# Patient Record
Sex: Female | Born: 1935 | Race: White | Hispanic: No | Smoking: Never smoker
Health system: Southern US, Community
[De-identification: ages and names within clinical notes are randomized; demographics above are authoritative.]

## PROBLEM LIST (undated history)

## (undated) DIAGNOSIS — G20 Parkinson's disease: Secondary | ICD-10-CM

## (undated) DIAGNOSIS — F319 Bipolar disorder, unspecified: Secondary | ICD-10-CM

## (undated) DIAGNOSIS — G20A1 Parkinson's disease without dyskinesia, without mention of fluctuations: Secondary | ICD-10-CM

## (undated) HISTORY — PX: ABDOMINAL HYSTERECTOMY: SHX81

## (undated) HISTORY — PX: COLON SURGERY: SHX602

---

## 2017-04-15 ENCOUNTER — Emergency Department: Payer: Medicare Other

## 2017-04-15 ENCOUNTER — Emergency Department
Admission: EM | Admit: 2017-04-15 | Discharge: 2017-04-16 | Disposition: A | Discharge status: Home / Self Care | Payer: Medicare Other | Attending: Emergency Medicine | Admitting: Emergency Medicine

## 2017-04-15 ENCOUNTER — Encounter: Payer: Self-pay | Admitting: Emergency Medicine

## 2017-04-15 DIAGNOSIS — R0982 Postnasal drip: Secondary | ICD-10-CM | POA: Insufficient documentation

## 2017-04-15 DIAGNOSIS — R140 Abdominal distension (gaseous): Secondary | ICD-10-CM

## 2017-04-15 DIAGNOSIS — G20 Parkinson's disease: Secondary | ICD-10-CM | POA: Insufficient documentation

## 2017-04-15 DIAGNOSIS — R05 Cough: Secondary | ICD-10-CM | POA: Insufficient documentation

## 2017-04-15 DIAGNOSIS — J309 Allergic rhinitis, unspecified: Secondary | ICD-10-CM | POA: Diagnosis not present

## 2017-04-15 DIAGNOSIS — R053 Chronic cough: Secondary | ICD-10-CM

## 2017-04-15 HISTORY — DX: Parkinson's disease without dyskinesia, without mention of fluctuations: G20.A1

## 2017-04-15 HISTORY — DX: Parkinson's disease: G20

## 2017-04-15 HISTORY — DX: Bipolar disorder, unspecified: F31.9

## 2017-04-15 MED ORDER — DOXEPIN HCL 10 MG PO CAPS
10.0000 mg | ORAL_CAPSULE | Freq: Once | ORAL | Status: AC
  Administered 2017-04-15: 10 mg via ORAL
  Filled 2017-04-15: qty 1

## 2017-04-15 MED ORDER — SODIUM CHLORIDE 0.9 % IV BOLUS (SEPSIS)
1000.0000 mL | Freq: Once | INTRAVENOUS | Status: AC
  Administered 2017-04-15: 1000 mL via INTRAVENOUS

## 2017-04-15 MED ORDER — DOCUSATE SODIUM 100 MG PO CAPS
200.0000 mg | ORAL_CAPSULE | Freq: Once | ORAL | Status: AC
  Administered 2017-04-15: 200 mg via ORAL
  Filled 2017-04-15: qty 2

## 2017-04-15 MED ORDER — PRIMIDONE 50 MG PO TABS
150.0000 mg | ORAL_TABLET | Freq: Once | ORAL | Status: AC
  Administered 2017-04-15: 150 mg via ORAL
  Filled 2017-04-15: qty 3

## 2017-04-15 MED ORDER — IOPAMIDOL (ISOVUE-300) INJECTION 61%
30.0000 mL | Freq: Once | INTRAVENOUS | Status: AC | PRN
  Administered 2017-04-15: 30 mL via ORAL

## 2017-04-15 NOTE — ED Notes (Signed)
Pt in xr
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
## 2017-04-15 NOTE — ED Triage Notes (Signed)
Patient has had a cough times a year but the cough has become worse over the past week. Patient does not have any shortness of breath.

## 2017-04-16 ENCOUNTER — Emergency Department: Payer: Medicare Other

## 2017-04-16 LAB — COMPREHENSIVE METABOLIC PANEL
ALBUMIN: 3.8 g/dL (ref 3.5–5.0)
ALT: 6 U/L — ABNORMAL LOW (ref 14–54)
AST: 15 U/L (ref 15–41)
Alkaline Phosphatase: 71 U/L (ref 38–126)
Anion gap: 8 (ref 5–15)
BILIRUBIN TOTAL: 0.4 mg/dL (ref 0.3–1.2)
BUN: 20 mg/dL (ref 6–20)
CHLORIDE: 94 mmol/L — AB (ref 101–111)
CO2: 32 mmol/L (ref 22–32)
Calcium: 9.2 mg/dL (ref 8.9–10.3)
Creatinine, Ser: 0.58 mg/dL (ref 0.44–1.00)
GFR calc Af Amer: >60 mL/min (ref >60)
GFR calc non Af Amer: >60 mL/min (ref >60)
GLUCOSE: 89 mg/dL (ref 65–99)
POTASSIUM: 3.9 mmol/L (ref 3.5–5.1)
Sodium: 134 mmol/L — ABNORMAL LOW (ref 135–145)
Total Protein: 7.8 g/dL (ref 6.5–8.1)

## 2017-04-16 LAB — CBC WITH DIFFERENTIAL/PLATELET
Basophils Absolute: 0 10*3/uL (ref 0–0.1)
Basophils Relative: 1 %
Eosinophils Absolute: 0.1 10*3/uL (ref 0–0.7)
Eosinophils Relative: 1 %
HEMATOCRIT: 41.2 % (ref 35.0–47.0)
Hemoglobin: 13.9 g/dL (ref 12.0–16.0)
Lymphocytes Relative: 23 %
Lymphs Abs: 1.7 10*3/uL (ref 1.0–3.6)
MCH: 31.7 pg (ref 26.0–34.0)
MCHC: 33.8 g/dL (ref 32.0–36.0)
MCV: 93.7 fL (ref 80.0–100.0)
MONO ABS: 0.8 10*3/uL (ref 0.2–0.9)
Monocytes Relative: 11 %
NEUTROS ABS: 4.6 10*3/uL (ref 1.4–6.5)
Neutrophils Relative %: 64 %
PLATELETS: 215 10*3/uL (ref 150–440)
RBC: 4.4 MIL/uL (ref 3.80–5.20)
RDW: 13.8 % (ref 11.5–14.5)
WBC: 7.1 10*3/uL (ref 3.6–11.0)

## 2017-04-16 LAB — LIPASE, BLOOD: Lipase: 23 U/L (ref 11–51)

## 2017-04-16 MED ORDER — CETIRIZINE HCL 10 MG PO TABS: 10.0000 mg | ORAL_TABLET | Freq: Every day | ORAL | 0 refills | Status: AC

## 2017-04-16 MED ORDER — IOPAMIDOL (ISOVUE-300) INJECTION 61%
100.0000 mL | Freq: Once | INTRAVENOUS | Status: AC | PRN
  Administered 2017-04-16: 100 mL via INTRAVENOUS

## 2017-04-16 MED ORDER — FLUTICASONE PROPIONATE 50 MCG/ACT NA SUSP: 1.0000 | Freq: Every day | NASAL | 0 refills | Status: AC

## 2017-04-16 MED ORDER — SIMETHICONE 80 MG PO CHEW: 80.0000 mg | CHEWABLE_TABLET | Freq: Four times a day (QID) | ORAL | 0 refills | Status: AC | PRN

## 2017-04-16 NOTE — ED Notes (Signed)
Patient bed linen, gown and brief changed. Patient repositioned in bed and given breakfast tray.

## 2017-04-16 NOTE — ED Notes (Signed)
Pt. Returned to tx. room in stable condition with no acute changes since departure from unit for scans.   

## 2017-04-16 NOTE — ED Notes (Signed)
Patient transported to CT 

## 2017-04-16 NOTE — ED Notes (Signed)
Call CT, pt finish oral contrast

## 2017-04-16 NOTE — ED Notes (Signed)
Pt placed on 2L O2NC for sleep apnea. Pt lying on back with equal, unlabored respirations, audible snoring heard. Family remains at bedside.

## 2017-04-19 ENCOUNTER — Observation Stay: Payer: Medicare Other

## 2017-04-19 ENCOUNTER — Inpatient Hospital Stay
Admission: EM | Admit: 2017-04-19 | Discharge: 2017-04-28 | DRG: 393 | Disposition: A | Discharge status: Hospice | Payer: Medicare Other | Attending: Internal Medicine | Admitting: Internal Medicine

## 2017-04-19 ENCOUNTER — Other Ambulatory Visit: Payer: Self-pay

## 2017-04-19 ENCOUNTER — Encounter: Payer: Self-pay | Admitting: Internal Medicine

## 2017-04-19 ENCOUNTER — Emergency Department: Payer: Medicare Other

## 2017-04-19 DIAGNOSIS — G9341 Metabolic encephalopathy: Secondary | ICD-10-CM | POA: Diagnosis not present

## 2017-04-19 DIAGNOSIS — E43 Unspecified severe protein-calorie malnutrition: Secondary | ICD-10-CM | POA: Diagnosis present

## 2017-04-19 DIAGNOSIS — J690 Pneumonitis due to inhalation of food and vomit: Secondary | ICD-10-CM | POA: Diagnosis present

## 2017-04-19 DIAGNOSIS — R109 Unspecified abdominal pain: Secondary | ICD-10-CM | POA: Diagnosis present

## 2017-04-19 DIAGNOSIS — Z7189 Other specified counseling: Secondary | ICD-10-CM

## 2017-04-19 DIAGNOSIS — Z993 Dependence on wheelchair: Secondary | ICD-10-CM | POA: Diagnosis not present

## 2017-04-19 DIAGNOSIS — L8990 Pressure ulcer of unspecified site, unspecified stage: Secondary | ICD-10-CM | POA: Insufficient documentation

## 2017-04-19 DIAGNOSIS — Z66 Do not resuscitate: Secondary | ICD-10-CM | POA: Diagnosis not present

## 2017-04-19 DIAGNOSIS — K5939 Other megacolon: Secondary | ICD-10-CM | POA: Diagnosis present

## 2017-04-19 DIAGNOSIS — M79604 Pain in right leg: Secondary | ICD-10-CM | POA: Diagnosis present

## 2017-04-19 DIAGNOSIS — Z23 Encounter for immunization: Secondary | ICD-10-CM | POA: Diagnosis present

## 2017-04-19 DIAGNOSIS — Z515 Encounter for palliative care: Secondary | ICD-10-CM

## 2017-04-19 DIAGNOSIS — G9389 Other specified disorders of brain: Secondary | ICD-10-CM | POA: Diagnosis present

## 2017-04-19 DIAGNOSIS — K6289 Other specified diseases of anus and rectum: Secondary | ICD-10-CM | POA: Diagnosis present

## 2017-04-19 DIAGNOSIS — K5641 Fecal impaction: Secondary | ICD-10-CM | POA: Diagnosis present

## 2017-04-19 DIAGNOSIS — Z880 Allergy status to penicillin: Secondary | ICD-10-CM

## 2017-04-19 DIAGNOSIS — D125 Benign neoplasm of sigmoid colon: Secondary | ICD-10-CM | POA: Diagnosis present

## 2017-04-19 DIAGNOSIS — F0390 Unspecified dementia without behavioral disturbance: Secondary | ICD-10-CM | POA: Diagnosis present

## 2017-04-19 DIAGNOSIS — E785 Hyperlipidemia, unspecified: Secondary | ICD-10-CM | POA: Diagnosis present

## 2017-04-19 DIAGNOSIS — L89301 Pressure ulcer of unspecified buttock, stage 1: Secondary | ICD-10-CM | POA: Diagnosis present

## 2017-04-19 DIAGNOSIS — R4182 Altered mental status, unspecified: Secondary | ICD-10-CM | POA: Diagnosis not present

## 2017-04-19 DIAGNOSIS — F319 Bipolar disorder, unspecified: Secondary | ICD-10-CM | POA: Diagnosis present

## 2017-04-19 DIAGNOSIS — E876 Hypokalemia: Secondary | ICD-10-CM | POA: Diagnosis present

## 2017-04-19 DIAGNOSIS — M79605 Pain in left leg: Secondary | ICD-10-CM | POA: Diagnosis present

## 2017-04-19 DIAGNOSIS — K5981 Ogilvie syndrome: Secondary | ICD-10-CM

## 2017-04-19 DIAGNOSIS — Z7982 Long term (current) use of aspirin: Secondary | ICD-10-CM

## 2017-04-19 DIAGNOSIS — K567 Ileus, unspecified: Secondary | ICD-10-CM | POA: Diagnosis not present

## 2017-04-19 DIAGNOSIS — K598 Other specified functional intestinal disorders: Secondary | ICD-10-CM | POA: Diagnosis present

## 2017-04-19 DIAGNOSIS — R140 Abdominal distension (gaseous): Secondary | ICD-10-CM | POA: Diagnosis not present

## 2017-04-19 DIAGNOSIS — Z6827 Body mass index (BMI) 27.0-27.9, adult: Secondary | ICD-10-CM | POA: Diagnosis not present

## 2017-04-19 DIAGNOSIS — G20 Parkinson's disease: Secondary | ICD-10-CM | POA: Diagnosis present

## 2017-04-19 DIAGNOSIS — Z79899 Other long term (current) drug therapy: Secondary | ICD-10-CM

## 2017-04-19 LAB — URINALYSIS, COMPLETE (UACMP) WITH MICROSCOPIC
Bilirubin Urine: NEGATIVE
Glucose, UA: NEGATIVE mg/dL
Ketones, ur: 20 mg/dL — AB
Nitrite: NEGATIVE
PH: 5 (ref 5.0–8.0)
Protein, ur: NEGATIVE mg/dL
SPECIFIC GRAVITY, URINE: 1.015 (ref 1.005–1.030)

## 2017-04-19 LAB — CBC WITH DIFFERENTIAL/PLATELET
BASOS ABS: 0 10*3/uL (ref 0–0.1)
BASOS PCT: 0 %
EOS ABS: 0.1 10*3/uL (ref 0–0.7)
EOS PCT: 1 %
HCT: 38.1 % (ref 35.0–47.0)
Hemoglobin: 13.2 g/dL (ref 12.0–16.0)
LYMPHS PCT: 10 %
Lymphs Abs: 1 10*3/uL (ref 1.0–3.6)
MCH: 31.4 pg (ref 26.0–34.0)
MCHC: 34.5 g/dL (ref 32.0–36.0)
MCV: 90.8 fL (ref 80.0–100.0)
MONO ABS: 1 10*3/uL — AB (ref 0.2–0.9)
Monocytes Relative: 10 %
Neutro Abs: 7.7 10*3/uL — ABNORMAL HIGH (ref 1.4–6.5)
Neutrophils Relative %: 79 %
PLATELETS: 209 10*3/uL (ref 150–440)
RBC: 4.19 MIL/uL (ref 3.80–5.20)
RDW: 13.8 % (ref 11.5–14.5)
WBC: 9.8 10*3/uL (ref 3.6–11.0)

## 2017-04-19 LAB — COMPREHENSIVE METABOLIC PANEL
ALT: 8 U/L — AB (ref 14–54)
AST: 16 U/L (ref 15–41)
Albumin: 3.4 g/dL — ABNORMAL LOW (ref 3.5–5.0)
Alkaline Phosphatase: 66 U/L (ref 38–126)
Anion gap: 9 (ref 5–15)
BILIRUBIN TOTAL: 0.3 mg/dL (ref 0.3–1.2)
BUN: 13 mg/dL (ref 6–20)
CO2: 31 mmol/L (ref 22–32)
CREATININE: 0.39 mg/dL — AB (ref 0.44–1.00)
Calcium: 9.3 mg/dL (ref 8.9–10.3)
Chloride: 95 mmol/L — ABNORMAL LOW (ref 101–111)
Glucose, Bld: 91 mg/dL (ref 65–99)
Potassium: 3.2 mmol/L — ABNORMAL LOW (ref 3.5–5.1)
Sodium: 135 mmol/L (ref 135–145)
Total Protein: 7.1 g/dL (ref 6.5–8.1)

## 2017-04-19 LAB — TROPONIN I

## 2017-04-19 LAB — GLUCOSE, CAPILLARY: Glucose-Capillary: 97 mg/dL (ref 65–99)

## 2017-04-19 LAB — MAGNESIUM: MAGNESIUM: 1.8 mg/dL (ref 1.7–2.4)

## 2017-04-19 LAB — MRSA PCR SCREENING: MRSA by PCR: NEGATIVE

## 2017-04-19 LAB — LIPASE, BLOOD: LIPASE: 16 U/L (ref 11–51)

## 2017-04-19 LAB — BRAIN NATRIURETIC PEPTIDE: B Natriuretic Peptide: 22 pg/mL (ref 0.0–100.0)

## 2017-04-19 MED ORDER — DOCUSATE SODIUM 100 MG PO CAPS
700.0000 mg | ORAL_CAPSULE | Freq: Every day | ORAL | Status: DC
  Administered 2017-04-19: 700 mg via ORAL
  Filled 2017-04-19: qty 7

## 2017-04-19 MED ORDER — DOXEPIN HCL 10 MG PO CAPS
10.0000 mg | ORAL_CAPSULE | Freq: Every day | ORAL | Status: DC
  Administered 2017-04-20 – 2017-04-26 (×7): 10 mg via ORAL
  Filled 2017-04-19 (×9): qty 1

## 2017-04-19 MED ORDER — POTASSIUM CHLORIDE IN NACL 40-0.9 MEQ/L-% IV SOLN
INTRAVENOUS | Status: DC
  Administered 2017-04-19 – 2017-04-20 (×2): 75 mL/h via INTRAVENOUS
  Filled 2017-04-19 (×2): qty 1000

## 2017-04-19 MED ORDER — DOXEPIN HCL 3 MG PO TABS: 3.0000 mg | ORAL_TABLET | Freq: Every day | ORAL | Status: DC

## 2017-04-19 MED ORDER — HEPARIN SODIUM (PORCINE) 5000 UNIT/ML IJ SOLN
5000.0000 [IU] | Freq: Three times a day (TID) | INTRAMUSCULAR | Status: DC
  Administered 2017-04-19 – 2017-04-27 (×23): 5000 [IU] via SUBCUTANEOUS
  Filled 2017-04-19 (×24): qty 1

## 2017-04-19 MED ORDER — ATORVASTATIN CALCIUM 20 MG PO TABS
20.0000 mg | ORAL_TABLET | Freq: Every evening | ORAL | Status: DC
  Administered 2017-04-19 – 2017-04-26 (×9): 20 mg via ORAL
  Filled 2017-04-19 (×8): qty 1

## 2017-04-19 MED ORDER — LACTULOSE 10 GM/15ML PO SOLN: 30.0000 g | Freq: Two times a day (BID) | ORAL | Status: DC | PRN

## 2017-04-19 MED ORDER — ASPIRIN EC 325 MG PO TBEC
325.0000 mg | DELAYED_RELEASE_TABLET | Freq: Every day | ORAL | Status: DC
  Administered 2017-04-19 – 2017-04-26 (×7): 325 mg via ORAL
  Filled 2017-04-19 (×8): qty 1

## 2017-04-19 MED ORDER — BISACODYL 10 MG RE SUPP
10.0000 mg | Freq: Every day | RECTAL | Status: DC
  Administered 2017-04-19 – 2017-04-20 (×2): 10 mg via RECTAL
  Filled 2017-04-19 (×2): qty 1

## 2017-04-19 MED ORDER — CARBIDOPA-LEVODOPA 25-100 MG PO TABS
1.0000 | ORAL_TABLET | Freq: Three times a day (TID) | ORAL | Status: DC
  Administered 2017-04-19 – 2017-04-26 (×20): 1 via ORAL
  Filled 2017-04-19 (×27): qty 1

## 2017-04-19 MED ORDER — OLANZAPINE 10 MG PO TABS
10.0000 mg | ORAL_TABLET | Freq: Every day | ORAL | Status: DC
  Administered 2017-04-19 – 2017-04-26 (×8): 10 mg via ORAL
  Filled 2017-04-19 (×11): qty 1

## 2017-04-19 MED ORDER — LIDOCAINE HCL 2 % EX GEL
CUTANEOUS | Status: AC
  Administered 2017-04-19: 1
  Filled 2017-04-19: qty 10

## 2017-04-19 MED ORDER — PRIMIDONE 50 MG PO TABS
50.0000 mg | ORAL_TABLET | Freq: Three times a day (TID) | ORAL | Status: DC
  Administered 2017-04-20 – 2017-04-26 (×19): 50 mg via ORAL
  Filled 2017-04-19 (×26): qty 1

## 2017-04-19 MED ORDER — LIDOCAINE HCL 2 % EX GEL
1.0000 "application " | Freq: Once | CUTANEOUS | Status: AC
  Administered 2017-04-19: 1

## 2017-04-19 MED ORDER — PHENOL 1.4 % MT LIQD
1.0000 | OROMUCOSAL | Status: DC | PRN
  Filled 2017-04-19: qty 177

## 2017-04-19 MED ORDER — DOCUSATE SODIUM 100 MG PO CAPS
100.0000 mg | ORAL_CAPSULE | Freq: Two times a day (BID) | ORAL | Status: DC | PRN
Start: 2017-04-19 — End: 2017-04-27

## 2017-04-19 NOTE — Progress Notes (Signed)
Upon assessment, RN noted that pt has a stage I on his sacrum. Area was cleansed and foam dressing was applied.   Jesalyn Finazzo [employer]
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
## 2017-04-19 NOTE — Progress Notes (Signed)
As per nurse called me, GI saw the pt- suggest to remove NGT and start on clear liquid diet, Home meds and bowel regimen.
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
## 2017-04-19 NOTE — ED Provider Notes (Signed)
[facility] Emergency Department Provider Note   ____________________________________________   First MD Initiated Contact with Patient 04/19/17 [phone]     (approximate)  I have reviewed the triage vital signs and the nursing notes.   HISTORY  Chief Complaint Abdominal Pain   HPI Kristen Oneill is a 82 y.o. female Comes from the nursing home as she was complaining of abdominal "extension" and bilateral leg pain. Please note I did not miss dictate extension.patient reports her abdomen is nontender to me and says that her legs have been hurting like this for some time. There is no new pain in her legs. She did not fall or injure her legs that she knows of. There are slightly swollen perhaps slightly more than usual. Patient was seen Thursday in the ER for abdominal distention. Patient had a CT done at that time which showed "severe" distention of the sigmoid. There was no obstruction.   Past Medical History:  Diagnosis Date  . Bipolar 1 disorder ([place])   . Parkinson disease ([place])     There are no active problems to display for this patient.   Past Surgical History:  Procedure Laterality Date  . ABDOMINAL HYSTERECTOMY    . COLON SURGERY      Prior to Admission medications   Medication Sig Start Date End Date Taking? Authorizing Provider  atorvastatin (LIPITOR) 20 MG tablet Take 20 mg by mouth daily.    [provider]  carbidopa-levodopa (SINEMET IR) 25-100 MG tablet Take 1 tablet by mouth 3 (three) times daily.    [provider]  cetirizine (ZYRTEC ALLERGY) 10 MG tablet Take 1 tablet (10 mg total) by mouth daily. 04/16/17   Gregor Hams, MD  fluticasone (FLONASE) 50 MCG/ACT nasal spray Place 1 spray into both nostrils daily. 04/16/17 04/26/17  Gregor Hams, MD  folic acid (FOLVITE) 1 MG tablet Take 1 mg by mouth daily.    [provider]  primidone (MYSOLINE) 50 MG tablet Take 50 mg by mouth 3 (three) times daily.     [provider]  SILENOR 3 MG TABS Take 3 mg by mouth daily.    [provider]  simethicone (GAS-X) 80 MG chewable tablet Chew 1 tablet (80 mg total) by mouth 4 (four) times daily as needed for flatulence. 04/16/17 05/01/17  Harvest Dark, MD    Allergies Penicillins  No family history on file.  Social History Social History  Substance Use Topics  . Smoking status: Never Smoker  . Smokeless tobacco: Never Used  . Alcohol use Not on file    Review of Systems  Constitutional: No fever/chills Eyes: No visual changes. ENT: No sore throat. Cardiovascular: Denies chest pain. Respiratory: Denies shortness of breath. Gastrointestinal: No abdominal pain.  No nausea, no vomiting.  No diarrhea.  No constipation. Genitourinary: Negative for dysuria. Musculoskeletal: Negative for back pain. Skin: Negative for rash. Neurological: Negative for headaches, focal weakness  ____________________________________________   PHYSICAL EXAM:  VITAL SIGNS: ED Triage Vitals  Enc Vitals Group     BP 04/19/17 0938 (!) 145/57     Pulse Rate 04/19/17 0938 89     Resp 04/19/17 0937 16     Temp 04/19/17 0937 98.5 F (36.9 C)     Temp Source 04/19/17 0937 Oral     SpO2 04/19/17 0938 95 %     Weight 04/19/17 0933 167 lb (75.8 kg)     Height 04/19/17 0933 5\' 6"  (1.676 m)  Head Circumference --      Peak Flow --      Pain Score --      Pain Loc --      Pain Edu? --      Excl. in [place]? --     Constitutional: Alert and oriented. Well appearing and in no acute distress. Eyes: Conjunctivae are normal.  Head: Atraumatic. Nose: No congestion/rhinnorhea. Mouth/Throat: Mucous membranes are moist.  Oropharynx non-erythematous. Neck: No stridor.  Cardiovascular: Normal rate, regular rhythm. Grossly normal heart sounds.  Good peripheral circulation. Respiratory: Normal respiratory effort.  No retractions. Lungs CTAB. Gastrointestinal: Soft and nontender. Abdomen is very  protuberant looks like she is about 9 months pregnant.. No abdominal bruits. No CVA tenderness. Musculoskeletal: No lower extremity tendernesssome mild edema.  No joint effusions.there is a bruise on the medial part of the right foot patient has no idea how it got there patient's son reports it's new Neurologic:  Normal speech and language. No gross focal neurologic deficits are appreciated.patient has not walked in many yearspatient has a resting parkinsonian tremor bilaterally but worse on the left hand Skin:  Skin is warm, dry and intact. No rash noted. Psychiatric: Mood and affect are normal. Speech and behavior are normal.  ____________________________________________   LABS (all labs ordered are listed, but only abnormal results are displayed)  Labs Reviewed  COMPREHENSIVE METABOLIC PANEL - Abnormal; Notable for the following:       Result Value   Potassium 3.2 (*)    Chloride 95 (*)    Creatinine, Ser 0.39 (*)    Albumin 3.4 (*)    ALT 8 (*)    All other components within normal limits  CBC WITH DIFFERENTIAL/PLATELET - Abnormal; Notable for the following:    Neutro Abs 7.7 (*)    Monocytes Absolute 1.0 (*)    All other components within normal limits  LIPASE, BLOOD  MAGNESIUM  TROPONIN I  BRAIN NATRIURETIC PEPTIDE  URINALYSIS, COMPLETE (UACMP) WITH MICROSCOPIC   ____________________________________________  EKG   ____________________________________________  RADIOLOGY  IMPRESSION: Soft tissue edema.  No acute bony abnormalities.   Electronically Signed   By: Dorise Bullion III M.D   On: 04/19/2017 10:43 IMPRESSION: Marked colonic distention remains, possibly representing ileus. Sigmoid dilatation has improved in the interval. Fecal loading in the ascending colon.   Electronically Signed   By: Dorise Bullion III M.D   On: 04/19/2017 10:40 ___IMPRESSION: Mild atelectasis in the left base.   Electronically Signed   By: Dorise Bullion III M.D    On: 04/19/2017 10:48_________________________________________   PROCEDURES  Procedure(s) performed:   Procedures  Critical Care performed:   ____________________________________________   INITIAL IMPRESSION / [place] / ED COURSE  Pertinent labs & imaging results that were available during my care of the patient were reviewed by me and considered in my medical decision making (see chart for details).  Is custom with gastroenterology. Dr. Modena Nunnery suggests NG tube and rectal tube. Colonoscopy once she is decompressed.      ____________________________________________   FINAL CLINICAL IMPRESSION(S) / ED DIAGNOSES  Final diagnoses:  Abdominal pain, unspecified abdominal location   ctual diagnosis is leg pain and colonic distention   NEW MEDICATIONS STARTED DURING THIS VISIT:  New Prescriptions   No medications on file     Note:  This document was prepared using Dragon voice recognition software and may include unintentional dictation errors.    Nena Polio, MD 04/19/17 1154

## 2017-04-19 NOTE — H&P (Signed)
[place] [facility] [place] NAME: Kristen Oneill    MR#:  [id number]  DATE OF BIRTH:  05-Mar-1935  DATE OF ADMISSION:  04/19/2017  PRIMARY CARE PHYSICIAN: Patient, No Pcp Per   REQUESTING/REFERRING PHYSICIAN: malinda  CHIEF COMPLAINT:   Chief Complaint  Patient presents with  . Abdominal Pain    HISTORY OF PRESENT ILLNESS: Kristen Oneill  is a 82 y.o. female with a known history of Bipolar and Prkinson's disease- lives in [place], wheelchair bound and have 24 hr care taker for 10 yrs, no dementia, able to eat on her own. Evacuated and in assisted living facility since last week due to [facility]. Came to ER 3 days ago for abdominal distention and pain- found colonic distention and stool, sent home. Came today again, with worsening distention and pain in legs. Xray confirms ileus. ER spoke to GI- suggested NG and rectal tubes and may do colonoscopy in 1-2 days. Leg pain is new , not legs, no injuries.  PAST MEDICAL HISTORY:   Past Medical History:  Diagnosis Date  . Bipolar 1 disorder ([place])   . Parkinson disease ([place])     PAST SURGICAL HISTORY: Past Surgical History:  Procedure Laterality Date  . ABDOMINAL HYSTERECTOMY    . COLON SURGERY      SOCIAL HISTORY:  Social History  Substance Use Topics  . Smoking status: Never Smoker  . Smokeless tobacco: Never Used  . Alcohol use Not on file    FAMILY HISTORY:  Family History  Problem Relation Age of Onset  . Bipolar disorder Brother     DRUG ALLERGIES:  Allergies  Allergen Reactions  . Penicillins     REVIEW OF SYSTEMS:   CONSTITUTIONAL: No fever, fatigue or weakness.  EYES: No blurred or double vision.  EARS, NOSE, AND THROAT: No tinnitus or ear pain.  RESPIRATORY: No cough, shortness of breath, wheezing or hemoptysis.  CARDIOVASCULAR: No chest pain, orthopnea, edema.  GASTROINTESTINAL: No nausea, vomiting, diarrhea or abdominal pain. Have distention in  abdomen. GENITOURINARY: No dysuria, hematuria.  ENDOCRINE: No polyuria, nocturia,  HEMATOLOGY: No anemia, easy bruising or bleeding SKIN: No rash or lesion. MUSCULOSKELETAL: No joint pain or arthritis.   NEUROLOGIC: No tingling, numbness, weakness.  PSYCHIATRY: No anxiety or depression.   MEDICATIONS AT HOME:  Prior to Admission medications   Medication Sig Start Date End Date Taking? Authorizing Provider  aspirin 325 MG EC tablet Take 325 mg by mouth daily.   Yes [provider]  atorvastatin (LIPITOR) 20 MG tablet Take 20 mg by mouth daily.   Yes [provider]  carbidopa-levodopa (SINEMET IR) 25-100 MG tablet Take 1 tablet by mouth 3 (three) times daily.   Yes [provider]  cetirizine (ZYRTEC ALLERGY) 10 MG tablet Take 1 tablet (10 mg total) by mouth daily. 04/16/17  Yes Gregor Hams, MD  Cholecalciferol (VITAMIN D3) 2000 units CHEW Chew 1 capsule by mouth daily.   Yes [provider]  divalproex (DEPAKOTE ER) 500 MG 24 hr tablet Take 1,000 mg by mouth daily.   Yes [provider]  docusate sodium (COLACE) 250 MG capsule Take 750 mg by mouth at bedtime.   Yes [provider]  doxepin (SINEQUAN) 10 MG capsule Take 10 mg by mouth at bedtime.   Yes [provider]  fluticasone (FLONASE) 50 MCG/ACT nasal spray Place 1 spray into both nostrils daily. 04/16/17 04/26/17 Yes Gregor Hams, MD  folic acid (FOLVITE) 1 MG  tablet Take 1 mg by mouth daily.   Yes [provider]  polyethylene glycol (MIRALAX / GLYCOLAX) packet Take 17 g by mouth daily as needed.   Yes [provider]  primidone (MYSOLINE) 50 MG tablet Take 50 mg by mouth 3 (three) times daily.   Yes [provider]  SILENOR 3 MG TABS Take 3 mg by mouth daily.   Yes [provider]  simethicone (GAS-X) 80 MG chewable tablet Chew 1 tablet (80 mg total) by mouth 4 (four) times daily as needed for flatulence. 04/16/17 05/01/17 Yes  PaduchowskiLennette Bihari, MD      PHYSICAL EXAMINATION:   VITAL SIGNS: Blood pressure 133/66, pulse 88, temperature 98.5 F (36.9 C), temperature source Oral, resp. rate 19, height 5\' 6"  (1.676 m), weight 75.8 kg (167 lb), SpO2 93 %.  GENERAL:  82 y.o.-year-old patient lying in the bed with acute distress.  EYES: Pupils equal, round, reactive to light and accommodation. No scleral icterus. Extraocular muscles intact.  HEENT: Head atraumatic, normocephalic. Oropharynx and nasopharynx clear.  NECK:  Supple, no jugular venous distention. No thyroid enlargement, no tenderness.  LUNGS: Normal breath sounds bilaterally, no wheezing, rales,rhonchi or crepitation. No use of accessory muscles of respiration.  CARDIOVASCULAR: S1, S2 normal. No murmurs, rubs, or gallops.  ABDOMEN: Soft, mild tender, extensive distended. Bowel sounds sluggish. No organomegaly or mass.  EXTREMITIES: No pedal edema, cyanosis, or clubbing.  NEUROLOGIC: Cranial nerves II through XII are intact. Muscle strength 3-4/5 in upper extremities- 1-2 in lower extremities and atrophic changes in toes and ankle of disuse . Sensation intact. Gait not checked.  PSYCHIATRIC: The patient is alert and oriented x 3.  SKIN: No obvious rash, lesion, or ulcer.   LABORATORY PANEL:   CBC  Recent Labs Lab 04/15/17 2344 04/19/17 0948  WBC 7.1 9.8  HGB 13.9 13.2  HCT 41.2 38.1  PLT 215 209  MCV 93.7 90.8  MCH 31.7 31.4  MCHC 33.8 34.5  RDW 13.8 13.8  LYMPHSABS 1.7 1.0  MONOABS 0.8 1.0*  EOSABS 0.1 0.1  BASOSABS 0.0 0.0   ------------------------------------------------------------------------------------------------------------------  Chemistries   Recent Labs Lab 04/15/17 2344 04/19/17 0948  NA 134* 135  K 3.9 3.2*  CL 94* 95*  CO2 32 31  GLUCOSE 89 91  BUN 20 13  CREATININE 0.58 0.39*  CALCIUM 9.2 9.3  MG  --  1.8  AST 15 16  ALT 6* 8*  ALKPHOS 71 66  BILITOT 0.4 0.3    ------------------------------------------------------------------------------------------------------------------ estimated creatinine clearance is 58.3 mL/min (A) (by C-G formula based on SCr of 0.39 mg/dL (L)). ------------------------------------------------------------------------------------------------------------------ No results for input(s): TSH, T4TOTAL, T3FREE, THYROIDAB in the last 72 hours.  Invalid input(s): FREET3   Coagulation profile No results for input(s): INR, PROTIME in the last 168 hours. ------------------------------------------------------------------------------------------------------------------- No results for input(s): DDIMER in the last 72 hours. -------------------------------------------------------------------------------------------------------------------  Cardiac Enzymes  Recent Labs Lab 04/19/17 0948  TROPONINI <0.03   ------------------------------------------------------------------------------------------------------------------ Invalid input(s): POCBNP  ---------------------------------------------------------------------------------------------------------------  Urinalysis No results found for: COLORURINE, APPEARANCEUR, LABSPEC, PHURINE, GLUCOSEU, HGBUR, BILIRUBINUR, KETONESUR, PROTEINUR, UROBILINOGEN, NITRITE, LEUKOCYTESUR   RADIOLOGY: Dg Chest 2 View  Result Date: 04/19/2017 CLINICAL DATA:  Leg pain.  Abdominal distention. EXAM: CHEST  2 VIEW COMPARISON:  April 15, 2017 FINDINGS: Mild atelectasis in the left base. No other changes or acute abnormalities. IMPRESSION: Mild atelectasis in the left base. Electronically Signed   By: Dorise Bullion III M.D   On: 04/19/2017 10:48   Dg Abdomen 1 View  Result Date: 04/19/2017 CLINICAL DATA:  Abdominal distension. EXAM: ABDOMEN - 1 VIEW COMPARISON:  April 16, 2017 CT scan FINDINGS: Fecal loading is seen in the ascending colon. Dilated loops of bowel in the abdomen are thought to  largely represent colon, with improvement of sigmoid dilatation since April 16, 2017. No free air or portal venous gas. No pneumatosis. IMPRESSION: Marked colonic distention remains, possibly representing ileus. Sigmoid dilatation has improved in the interval. Fecal loading in the ascending colon. Electronically Signed   By: Dorise Bullion III M.D   On: 04/19/2017 10:40   Dg Foot Complete Right  Result Date: 04/19/2017 CLINICAL DATA:  Leg pain and abdominal distention. EXAM: RIGHT FOOT COMPLETE - 3+ VIEW COMPARISON:  None. FINDINGS: Healed distal metatarsal fractures. No acute fractures are seen explain the patient's medial bruising and pain. Soft tissue edema is noted. IMPRESSION: Soft tissue edema.  No acute bony abnormalities. Electronically Signed   By: Dorise Bullion III M.D   On: 04/19/2017 10:43    EKG: Orders placed or performed in visit on 04/19/17  . EKG 12-Lead    IMPRESSION AND PLAN:  * Ileus   Impacted stool in ascending colon   ER spoke to GI- NG and rectal tube and may do colonoscopy.    Meanwhile NPO, and keep on IV fluids   Blood sugar check 4 time a day.  * Hypokalemia   Replace with IV NS  * Leg pain    ER ordered DVT studies- follow.  * parkinson's and bipolar   As per son- was on Zypraxa 10 mg daily for many years, changed 4 weeks ago to abilify and dilantin, but they are not helping much and requesting to change back to zypraxa.    Currently hold all meds due to ileus and keeping NPO.  All the records are reviewed and case discussed with ED provider. Management plans discussed with the patient, family and they are in agreement.  CODE STATUS: full. Code Status History    This patient does not have a recorded code status. Please follow your organizational policy for patients in this situation.       TOTAL TIME TAKING CARE OF THIS PATIENT: 50 minutes.  Spoke to her son in room.  Vaughan Basta M.D on 04/19/2017   Between 7am to 6pm - Pager  - [phone]  After 6pm go to [email or web address] - password [email or web address] [facility]  Office  [phone]  CC: Primary care physician; Patient, No Pcp Per   Note: This dictation was prepared with Dragon dictation along with smaller phrase technology. Any transcriptional errors that result from this process are unintentional.

## 2017-04-19 NOTE — Progress Notes (Signed)
Spoke with Dr.Hugelmeyer regarding colace order of 700 mg, I explained to Dr.Hugelmyer that patient only took 2 capsules and refused to take anymore  after I had scanned all 7. Dr.Hugelmeyer verbalized to discontinue the order for 700 mg of colace. Kristen Oneill

## 2017-04-19 NOTE — Progress Notes (Signed)
Verbal order from Dr. Tarri Glenn to remove NG and rectal tube, start pt on clear liquids and advance as tolerated, give a tap water enema, and to call prime doctor to start home medications.   Kristen Oneill [employer]

## 2017-04-19 NOTE — ED Notes (Signed)
Ultrasound at bedside doing ultrasound
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
## 2017-04-19 NOTE — ED Notes (Signed)
Pt refused straight cath.  Informed nurse that the sample acquired via bedpan is unsuitable for a UA

## 2017-04-20 ENCOUNTER — Inpatient Hospital Stay: Payer: Medicare Other

## 2017-04-20 DIAGNOSIS — R109 Unspecified abdominal pain: Secondary | ICD-10-CM

## 2017-04-20 DIAGNOSIS — L8990 Pressure ulcer of unspecified site, unspecified stage: Secondary | ICD-10-CM | POA: Insufficient documentation

## 2017-04-20 LAB — GLUCOSE, CAPILLARY
GLUCOSE-CAPILLARY: 87 mg/dL (ref 65–99)
Glucose-Capillary: 96 mg/dL (ref 65–99)
Glucose-Capillary: 80 mg/dL (ref 65–99)

## 2017-04-20 LAB — BASIC METABOLIC PANEL
ANION GAP: 9 (ref 5–15)
BUN: 13 mg/dL (ref 6–20)
CALCIUM: 9.1 mg/dL (ref 8.9–10.3)
CO2: 30 mmol/L (ref 22–32)
CREATININE: 0.39 mg/dL — AB (ref 0.44–1.00)
Chloride: 98 mmol/L — ABNORMAL LOW (ref 101–111)
Glucose, Bld: 104 mg/dL — ABNORMAL HIGH (ref 65–99)
Potassium: 4 mmol/L (ref 3.5–5.1)
Sodium: 137 mmol/L (ref 135–145)

## 2017-04-20 LAB — CBC
HCT: 40.8 % (ref 35.0–47.0)
HEMOGLOBIN: 14.2 g/dL (ref 12.0–16.0)
MCH: 32.5 pg (ref 26.0–34.0)
MCHC: 34.8 g/dL (ref 32.0–36.0)
MCV: 93.4 fL (ref 80.0–100.0)
PLATELETS: 204 10*3/uL (ref 150–440)
RBC: 4.37 MIL/uL (ref 3.80–5.20)
RDW: 13.9 % (ref 11.5–14.5)
WBC: 10.7 10*3/uL (ref 3.6–11.0)

## 2017-04-20 LAB — TSH: TSH: 4.914 u[IU]/mL — AB (ref 0.350–4.500)

## 2017-04-20 MED ORDER — SODIUM CHLORIDE 0.9 % IV SOLN
INTRAVENOUS | Status: DC
  Administered 2017-04-20 – 2017-04-21 (×2): via INTRAVENOUS

## 2017-04-20 MED ORDER — PEG 3350-KCL-NA BICARB-NACL 420 G PO SOLR
1500.0000 mL | Freq: Once | ORAL | Status: AC
  Administered 2017-04-20: 1500 mL via ORAL
  Filled 2017-04-20: qty 4000

## 2017-04-20 MED ORDER — LORATADINE 10 MG PO TABS
10.0000 mg | ORAL_TABLET | Freq: Every day | ORAL | Status: DC
  Administered 2017-04-20 – 2017-04-26 (×6): 10 mg via ORAL
  Filled 2017-04-20 (×7): qty 1

## 2017-04-20 NOTE — Progress Notes (Addendum)
Patient ID: Kristen Oneill, female   DOB: Feb 16, 1935, 82 y.o.   MRN: [id number]  [facility] PROGRESS NOTE  Kristen Oneill LGX:[id number] DOB: [birth year] DOA: 04/19/2017 PCP: Patient, No Pcp Per  HPI/Subjective: Patient seen this morning and did not complain of any abdominal pain at that time. No nausea vomiting. She felt hungry.  Objective: Vitals:   04/20/17 0520 04/20/17 1241  BP: 136/78 118/65  Pulse: (!) 114 92  Resp: (!) 22 18  Temp: 98.4 F (36.9 C) (!) 97.5 F (36.4 C)  SpO2: 96% 97%    Filed Weights   04/19/17 0933  Weight: 75.8 kg (167 lb)    ROS: Review of Systems  Constitutional: Negative for chills and fever.  Eyes: Negative for blurred vision.  Respiratory: Negative for cough and shortness of breath.   Cardiovascular: Negative for chest pain.  Gastrointestinal: Positive for constipation. Negative for abdominal pain, diarrhea, nausea and vomiting.  Genitourinary: Negative for dysuria.  Musculoskeletal: Negative for joint pain.  Neurological: Negative for dizziness and headaches.   Exam: Physical Exam  HENT:  Nose: No mucosal edema.  Mouth/Throat: Uvula swelling present. No oropharyngeal exudate or posterior oropharyngeal edema.  Eyes: Pupils are equal, round, and reactive to light. Conjunctivae, EOM and lids are normal.  Neck: No JVD present. Carotid bruit is not present. No edema present. No thyroid mass and no thyromegaly present.  Cardiovascular: S1 normal and S2 normal.  Exam reveals no gallop.   No murmur heard. Pulses:      Dorsalis pedis pulses are 2+ on the right side, and 2+ on the left side.  Respiratory: No respiratory distress. She has decreased breath sounds in the right lower field and the left lower field. She has no wheezes. She has no rhonchi. She has no rales.  GI: Soft. Bowel sounds are normal. There is no tenderness.  Musculoskeletal:       Right ankle: She exhibits swelling.       Left ankle: She exhibits swelling.  Lymphadenopathy:     She has no cervical adenopathy.  Neurological: She is alert. No cranial nerve deficit.  Skin: Skin is warm. Nails show no clubbing.  Stage I decubitus ulcer on buttock  Psychiatric: She has a normal mood and affect.      Data Reviewed: Basic Metabolic Panel:  Recent Labs Lab 04/15/17 2344 04/19/17 0948 04/20/17 0315  NA 134* 135 137  K 3.9 3.2* 4.0  CL 94* 95* 98*  CO2 32 31 30  GLUCOSE 89 91 104*  BUN 20 13 13   CREATININE 0.58 0.39* 0.39*  CALCIUM 9.2 9.3 9.1  MG  --  1.8  --    Liver Function Tests:  Recent Labs Lab 04/15/17 2344 04/19/17 0948  AST 15 16  ALT 6* 8*  ALKPHOS 71 66  BILITOT 0.4 0.3  PROT 7.8 7.1  ALBUMIN 3.8 3.4*    Recent Labs Lab 04/15/17 2344 04/19/17 0948  LIPASE 23 16   No results for input(s): AMMONIA in the last 168 hours. CBC:  Recent Labs Lab 04/15/17 2344 04/19/17 0948 04/20/17 0315  WBC 7.1 9.8 10.7  NEUTROABS 4.6 7.7*  --   HGB 13.9 13.2 14.2  HCT 41.2 38.1 40.8  MCV 93.7 90.8 93.4  PLT 215 209 204   Cardiac Enzymes:  Recent Labs Lab 04/19/17 0948  TROPONINI <0.03   BNP (last 3 results)  Recent Labs  04/19/17 0948  BNP 22.0     CBG:  Recent Labs Lab 04/19/17 1831  04/20/17 0112 04/20/17 0516 04/20/17 1211  GLUCAP 97 87 96 80    Recent Results (from the past 240 hour(s))  MRSA PCR Screening     Status: None   Collection Time: 04/19/17  3:11 PM  Result Value Ref Range Status   MRSA by PCR NEGATIVE NEGATIVE Final    Comment:        The GeneXpert MRSA Assay (FDA approved for NASAL specimens only), is one component of a comprehensive MRSA colonization surveillance program. It is not intended to diagnose MRSA infection nor to guide or monitor treatment for MRSA infections.      Studies: Dg Chest 2 View  Result Date: 04/19/2017 CLINICAL DATA:  Leg pain.  Abdominal distention. EXAM: CHEST  2 VIEW COMPARISON:  April 15, 2017 FINDINGS: Mild atelectasis in the left base. No other  changes or acute abnormalities. IMPRESSION: Mild atelectasis in the left base. Electronically Signed   By: Dorise Bullion III M.D   On: 04/19/2017 10:48   Dg Abdomen 1 View  Result Date: 04/19/2017 CLINICAL DATA:  Abdominal distension. EXAM: ABDOMEN - 1 VIEW COMPARISON:  April 16, 2017 CT scan FINDINGS: Fecal loading is seen in the ascending colon. Dilated loops of bowel in the abdomen are thought to largely represent colon, with improvement of sigmoid dilatation since April 16, 2017. No free air or portal venous gas. No pneumatosis. IMPRESSION: Marked colonic distention remains, possibly representing ileus. Sigmoid dilatation has improved in the interval. Fecal loading in the ascending colon. Electronically Signed   By: Dorise Bullion III M.D   On: 04/19/2017 10:40   US Venous Img Lower Bilateral  Result Date: 04/19/2017 CLINICAL DATA:  Leg pain starting this morning.  Chronic swelling. EXAM: BILATERAL LOWER EXTREMITY VENOUS DOPPLER ULTRASOUND TECHNIQUE: Gray-scale sonography with graded compression, as well as color Doppler and duplex ultrasound were performed to evaluate the lower extremity deep venous systems from the level of the common femoral vein and including the common femoral, femoral, profunda femoral, popliteal and calf veins including the posterior tibial, peroneal and gastrocnemius veins when visible. The superficial great saphenous vein was also interrogated. Spectral Doppler was utilized to evaluate flow at rest and with distal augmentation maneuvers in the common femoral, femoral and popliteal veins. COMPARISON:  None. FINDINGS: RIGHT LOWER EXTREMITY Common Femoral Vein: No evidence of thrombus. Normal compressibility, respiratory phasicity and response to augmentation. Saphenofemoral Junction: No evidence of thrombus. Normal compressibility and flow on color Doppler imaging. Profunda Femoral Vein: No evidence of thrombus. Normal compressibility and flow on color Doppler imaging.  Femoral Vein: No evidence of thrombus. Normal compressibility, respiratory phasicity and response to augmentation. Popliteal Vein: No evidence of thrombus. Normal compressibility, respiratory phasicity and response to augmentation. Calf Veins: No evidence of thrombus. Normal compressibility and flow on color Doppler imaging. However, please note that the right peroneal vein was not well visualized. Superficial Great Saphenous Vein: No evidence of thrombus. Normal compressibility and flow on color Doppler imaging. Venous Reflux:  None. Other Findings:  None. LEFT LOWER EXTREMITY Common Femoral Vein: No evidence of thrombus. Normal compressibility, respiratory phasicity and response to augmentation. Saphenofemoral Junction: No evidence of thrombus. Normal compressibility and flow on color Doppler imaging. Profunda Femoral Vein: No evidence of thrombus. Normal compressibility and flow on color Doppler imaging. Femoral Vein: No evidence of thrombus. Normal compressibility, respiratory phasicity and response to augmentation. Popliteal Vein: No evidence of thrombus. Normal compressibility, respiratory phasicity and response to augmentation. Calf Veins: Not well visualized on today's exam. Superficial  Great Saphenous Vein: No evidence of thrombus. Normal compressibility and flow on color Doppler imaging. Venous Reflux:  None. Other Findings:  None. IMPRESSION: 1. No evidence of DVT within either lower extremity. 2. Poor visualization of the calf veins on the left, and of the right peroneal vein, attributed to small caliber of veins. Electronically Signed   By: Van Clines M.D.   On: 04/19/2017 14:10   Dg Abd Acute W/chest  Result Date: 04/20/2017 CLINICAL DATA:  Ogilvie's syndrome EXAM: DG ABDOMEN ACUTE W/ 1V CHEST COMPARISON:  04/19/2017 FINDINGS: Heart is normal size.  Lungs are clear.  No effusions. Marked gaseous distention of the colon again noted, not significantly changed. Prior CABG. No organomegaly or  free air. IMPRESSION: Continued marked gaseous distention of the colon, unchanged. No free air. No acute cardiopulmonary disease. Electronically Signed   By: Rolm Baptise M.D.   On: 04/20/2017 07:48   Dg Abd Portable 1 View  Result Date: 04/19/2017 CLINICAL DATA:  NG tube placement EXAM: PORTABLE ABDOMEN - 1 VIEW COMPARISON:  None. FINDINGS: The NG tube terminates at midline, likely within the gastric antrum. Continued colonic distention. Probable atelectasis in the left base. IMPRESSION: The NG tube is thought to terminate in the gastric antrum. Continued colonic distention. Electronically Signed   By: Dorise Bullion III M.D   On: 04/19/2017 12:50   Dg Foot Complete Right  Result Date: 04/19/2017 CLINICAL DATA:  Leg pain and abdominal distention. EXAM: RIGHT FOOT COMPLETE - 3+ VIEW COMPARISON:  None. FINDINGS: Healed distal metatarsal fractures. No acute fractures are seen explain the patient's medial bruising and pain. Soft tissue edema is noted. IMPRESSION: Soft tissue edema.  No acute bony abnormalities. Electronically Signed   By: Dorise Bullion III M.D   On: 04/19/2017 10:43    Scheduled Meds: . aspirin  325 mg Oral Daily  . atorvastatin  20 mg Oral QPM  . bisacodyl  10 mg Rectal Daily  . carbidopa-levodopa  1 tablet Oral TID  . doxepin  10 mg Oral QHS  . heparin  5,000 Units Subcutaneous Q8H  . loratadine  10 mg Oral Daily  . OLANZapine  10 mg Oral QHS  . primidone  50 mg Oral TID   Continuous Infusions: . sodium chloride 50 mL/hr at 04/20/17 0735    Assessment/Plan:  1. 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 GI ordered GoLYTELY.  The patient will need to be on anti-constipation medications routinely. 2. Parkinson's disease on Sinemet 3. Bipolar disorder on olanzapine 4. Hyperlipidemia unspecified on atorvastatin 5. Stage I decubitus ulcer on buttock. Change positions frequently 6. Appreciate swallow evaluation 7. Uvula swollen. Start  Claritin. 8. Suspect sleep apnea overnight ximetry.  Code Status:     Code Status Orders        Start     Ordered   04/19/17 1419  Full code  Continuous     04/19/17 1418    Code Status History    Date Active Date Inactive Code Status Order ID Comments User Context   This patient has a current code status but no historical code status.     Family Communication: spoke with the son on the phone Disposition Plan: evaluate daily for disposition  Consultants:  General surgery  Gastroenterology  Time spent: 28 minutes  Loletha Grayer  [employer]

## 2017-04-20 NOTE — Clinical Social Work Note (Signed)
Clinical Social Work Assessment  Patient Details  Name: Kristen Oneill MRN: [id number] Date of Birth: 1934-10-12  Date of referral:  04/20/17               Reason for consult:  Discharge Planning                Permission sought to share information with:  Family Supports, Customer service manager Permission granted to share information::  Yes, Verbal Permission Granted  Name::        Agency::     Relationship::     Contact Information:     Housing/Transportation Living arrangements for the past 2 months:  [place] of Information:  Patient, Facility Patient Interpreter Needed:  None Criminal Activity/Legal Involvement Pertinent to Current Situation/Hospitalization:  No - Comment as needed Significant Relationships:  Adult Children Lives with:  Facility Resident Do you feel safe going back to the place where you live?  Yes Need for family participation in patient care:  Yes (Comment)  Care giving concerns:  Patient  Lives in [place], [place] and is an evacuee from the recent [place].    Social Worker assessment / plan:  Patient and her son: Kristen Oneill: [phone] evacuated from [place], [place] and came to the [place] area for shelter. Patient's son is reportedly at the local shelter and patient was temporarily/emergently placed at [facility]. Patient is typically wheelchair bound and reportedly has a caregiver for the past 10 years. Patient's son was here at the hospital but CSW just missed him. CSW was visited by [place] caseworker who was following from the shelter. The caseworker contact number is: [phone] and the son is staying at the [facility] temporary shelter. CSW will attempt to speak with patient's son today.  Employment status:  Retired Nurse, adult PT Recommendations:    Information / Referral to community resources:     Patient/Family's Response to care:  Patient expressed  appreciation for CSW visit.  Patient/Family's Understanding of and Emotional Response to Diagnosis, Current Treatment, and Prognosis:  Patient was in some discomfort at time of CSW visit. Will attempt to reach out to her son today.  Emotional Assessment Appearance:  Appears stated age Attitude/Demeanor/Rapport:   (pleasant and cooperative) Affect (typically observed):  Adaptable, Calm, Pleasant Orientation:  Oriented to Self, Oriented to Situation Alcohol / Substance use:  Not Applicable Psych involvement (Current and /or in the community):  No (Comment)  Discharge Needs  Concerns to be addressed:  Care Coordination Readmission within the last 30 days:  No Current discharge risk:  None Barriers to Discharge:  No Barriers Identified   Shela Leff, LCSW 04/20/2017, 12:07 PM

## 2017-04-20 NOTE — Progress Notes (Signed)
SURGICAL PROGRESS NOTE (cpt [phone])  Hospital Day(s): 1.   Post op day(s):  Marland Kitchen   Interval History: Patient seen and examined, no acute events or new complaints overnight. Patient reports her abdominal distention feels better today than yesterday, and she denies any abdominal pain, N/V, fever/chills, CP, or SOB.  Review of Systems:  Constitutional: denies fever, chills  HEENT: denies cough or congestion  Respiratory: denies any shortness of breath  Cardiovascular: denies chest pain or palpitations  Gastrointestinal: abdominal pain, N/V, and bowel function as per interval history Genitourinary: denies burning with urination or urinary frequency Musculoskeletal: denies pain, decreased motor or sensation Integumentary: denies any other rashes or skin discolorations Neurological: denies HA or vision/hearing changes   Vital signs in last 24 hours: [min-max] current  Temp:  [97.5 F (36.4 C)-99.1 F (37.3 C)] 97.5 F (36.4 C) (09/17 1241) Pulse Rate:  [90-114] 92 (09/17 1241) Resp:  [18-22] 18 (09/17 1241) BP: (118-152)/(65-78) 118/65 (09/17 1241) SpO2:  [92 %-100 %] 97 % (09/17 1241)     Height: 5\' 6"  (167.6 cm) Weight: 167 lb (75.8 kg) BMI (Calculated): 26.97   Intake/Output this shift:  No intake/output data recorded.   Intake/Output last 2 shifts:  @IOLAST2SHIFTS @   Physical Exam:  Constitutional: alert, cooperative and no distress  HENT: normocephalic without obvious abnormality  Eyes: PERRL, EOM's grossly intact and symmetric  Neuro: CN II - XII grossly intact and symmetric without deficit  Respiratory: breathing non-labored at rest  Cardiovascular: regular rate and sinus rhythm  Gastrointestinal: soft and non-tender with protuberant distended abdomen Musculoskeletal: UE and LE FROM, motor and sensation grossly intact  Labs:  CBC Latest Ref Rng & Units 04/20/2017 04/19/2017 04/15/2017  WBC 3.6 - 11.0 K/uL 10.7 9.8 7.1  Hemoglobin 12.0 - 16.0 g/dL 14.2 13.2 13.9   Hematocrit 35.0 - 47.0 % 40.8 38.1 41.2  Platelets 150 - 440 K/uL 204 209 215   CMP Latest Ref Rng & Units 04/20/2017 04/19/2017 04/15/2017  Glucose 65 - 99 mg/dL 104(H) 91 89  BUN 6 - 20 mg/dL 13 13 20   Creatinine 0.44 - 1.00 mg/dL 0.39(L) 0.39(L) 0.58  Sodium 135 - 145 mmol/L 137 135 134(L)  Potassium 3.5 - 5.1 mmol/L 4.0 3.2(L) 3.9  Chloride 101 - 111 mmol/L 98(L) 95(L) 94(L)  CO2 22 - 32 mmol/L 30 31 32  Calcium 8.9 - 10.3 mg/dL 9.1 9.3 9.2  Total Protein 6.5 - 8.1 g/dL - 7.1 7.8  Total Bilirubin 0.3 - 1.2 mg/dL - 0.3 0.4  Alkaline Phos 38 - 126 U/L - 66 71  AST 15 - 41 U/L - 16 15  ALT 14 - 54 U/L - 8(L) 6(L)   Imaging studies:  Abdominal X-ray (04/21/2017) Continued marked gaseous distention of the colon, unchanged. No free air.   Assessment/Plan: (ICD-10's: K56.0) 82 y.o. wheelchair-bound female with Ogilvie's colonic pseudo-obstruction syndrome, complicated by ascending/Right colon stool burdon/constipation and by comorbidities including Parkinson's disease and bipolar disorder, requiring full-time (24/7) care x 10 years.   - no indication for surgical intervention at this time  - GI consultation and recommendations appreciated  - bowel regimen/prep, colonoscopic decompression, and neostigmine as per GI  - will continue to follow peripherally for now, please call if questions  - medical management as per primary medical team  - DVT prophylaxis   All of the above findings and recommendations were discussed with the patient, and all of patient's questions were answered to her expressed satisfaction.  Thank you for the opportunity  to participate in this patient's care.  -- Marilynne Drivers Rosana Hoes, MD, [place]: [facility] - Partnering for exceptional care. Office: [phone]

## 2017-04-20 NOTE — Progress Notes (Signed)
Jonathon Bellows MD, MRCP(U.K) [address]  [place]  [place], [place] [address]  Main: [phone]    Kristen Oneill is being followed for colonic ileus  Subjective: She says she is feeling better, abdominal distension "better than yesterday "   Objective: Vital signs in last 24 hours: Vitals:   04/19/17 1300 04/19/17 1443 04/19/17 1951 04/20/17 0520  BP: (!) 141/77 (!) 152/74 139/70 136/78  Pulse: 88 90 90 (!) 114  Resp: (!) 22 20 20  (!) 22  Temp:  99.1 F (37.3 C) 98.4 F (36.9 C) 98.4 F (36.9 C)  TempSrc:  Oral Oral Oral  SpO2: 93% 92% 100% 96%  Weight:      Height:       Weight change:   Intake/Output Summary (Last 24 hours) at 04/20/17 0945 Last data filed at 04/20/17 0500  Gross per 24 hour  Intake              996 ml  Output              650 ml  Net              346 ml     Exam: Heart:: Regular rate and rhythm, S1S2 present or without murmur or extra heart sounds Lungs: normal, clear to auscultation and clear to auscultation and percussion Abdomen: distended, non tender, no guarding or rigidity , Bs+   Lab Results: @LABTEST2 @ Micro Results: Recent Results (from the past 240 hour(s))  MRSA PCR Screening     Status: None   Collection Time: 04/19/17  3:11 PM  Result Value Ref Range Status   MRSA by PCR NEGATIVE NEGATIVE Final    Comment:        The GeneXpert MRSA Assay (FDA approved for NASAL specimens only), is one component of a comprehensive MRSA colonization surveillance program. It is not intended to diagnose MRSA infection nor to guide or monitor treatment for MRSA infections.    Studies/Results: Dg Chest 2 View  Result Date: 04/19/2017 CLINICAL DATA:  Leg pain.  Abdominal distention. EXAM: CHEST  2 VIEW COMPARISON:  April 15, 2017 FINDINGS: Mild atelectasis in the left base. No other changes or acute abnormalities. IMPRESSION: Mild atelectasis in the left base. Electronically Signed   By: Dorise Bullion III M.D   On:  04/19/2017 10:48   Dg Abdomen 1 View  Result Date: 04/19/2017 CLINICAL DATA:  Abdominal distension. EXAM: ABDOMEN - 1 VIEW COMPARISON:  April 16, 2017 CT scan FINDINGS: Fecal loading is seen in the ascending colon. Dilated loops of bowel in the abdomen are thought to largely represent colon, with improvement of sigmoid dilatation since April 16, 2017. No free air or portal venous gas. No pneumatosis. IMPRESSION: Marked colonic distention remains, possibly representing ileus. Sigmoid dilatation has improved in the interval. Fecal loading in the ascending colon. Electronically Signed   By: Dorise Bullion III M.D   On: 04/19/2017 10:40   US Venous Img Lower Bilateral  Result Date: 04/19/2017 CLINICAL DATA:  Leg pain starting this morning.  Chronic swelling. EXAM: BILATERAL LOWER EXTREMITY VENOUS DOPPLER ULTRASOUND TECHNIQUE: Gray-scale sonography with graded compression, as well as color Doppler and duplex ultrasound were performed to evaluate the lower extremity deep venous systems from the level of the common femoral vein and including the common femoral, femoral, profunda femoral, popliteal and calf veins including the posterior tibial, peroneal and gastrocnemius veins when visible. The superficial great saphenous vein was also interrogated. Spectral Doppler was utilized  to evaluate flow at rest and with distal augmentation maneuvers in the common femoral, femoral and popliteal veins. COMPARISON:  None. FINDINGS: RIGHT LOWER EXTREMITY Common Femoral Vein: No evidence of thrombus. Normal compressibility, respiratory phasicity and response to augmentation. Saphenofemoral Junction: No evidence of thrombus. Normal compressibility and flow on color Doppler imaging. Profunda Femoral Vein: No evidence of thrombus. Normal compressibility and flow on color Doppler imaging. Femoral Vein: No evidence of thrombus. Normal compressibility, respiratory phasicity and response to augmentation. Popliteal Vein: No  evidence of thrombus. Normal compressibility, respiratory phasicity and response to augmentation. Calf Veins: No evidence of thrombus. Normal compressibility and flow on color Doppler imaging. However, please note that the right peroneal vein was not well visualized. Superficial Great Saphenous Vein: No evidence of thrombus. Normal compressibility and flow on color Doppler imaging. Venous Reflux:  None. Other Findings:  None. LEFT LOWER EXTREMITY Common Femoral Vein: No evidence of thrombus. Normal compressibility, respiratory phasicity and response to augmentation. Saphenofemoral Junction: No evidence of thrombus. Normal compressibility and flow on color Doppler imaging. Profunda Femoral Vein: No evidence of thrombus. Normal compressibility and flow on color Doppler imaging. Femoral Vein: No evidence of thrombus. Normal compressibility, respiratory phasicity and response to augmentation. Popliteal Vein: No evidence of thrombus. Normal compressibility, respiratory phasicity and response to augmentation. Calf Veins: Not well visualized on today's exam. Superficial Great Saphenous Vein: No evidence of thrombus. Normal compressibility and flow on color Doppler imaging. Venous Reflux:  None. Other Findings:  None. IMPRESSION: 1. No evidence of DVT within either lower extremity. 2. Poor visualization of the calf veins on the left, and of the right peroneal vein, attributed to small caliber of veins. Electronically Signed   By: Van Clines M.D.   On: 04/19/2017 14:10   Dg Abd Acute W/chest  Result Date: 04/20/2017 CLINICAL DATA:  Ogilvie's syndrome EXAM: DG ABDOMEN ACUTE W/ 1V CHEST COMPARISON:  04/19/2017 FINDINGS: Heart is normal size.  Lungs are clear.  No effusions. Marked gaseous distention of the colon again noted, not significantly changed. Prior CABG. No organomegaly or free air. IMPRESSION: Continued marked gaseous distention of the colon, unchanged. No free air. No acute cardiopulmonary disease.  Electronically Signed   By: Rolm Baptise M.D.   On: 04/20/2017 07:48   Dg Abd Portable 1 View  Result Date: 04/19/2017 CLINICAL DATA:  NG tube placement EXAM: PORTABLE ABDOMEN - 1 VIEW COMPARISON:  None. FINDINGS: The NG tube terminates at midline, likely within the gastric antrum. Continued colonic distention. Probable atelectasis in the left base. IMPRESSION: The NG tube is thought to terminate in the gastric antrum. Continued colonic distention. Electronically Signed   By: Dorise Bullion III M.D   On: 04/19/2017 12:50   Dg Foot Complete Right  Result Date: 04/19/2017 CLINICAL DATA:  Leg pain and abdominal distention. EXAM: RIGHT FOOT COMPLETE - 3+ VIEW COMPARISON:  None. FINDINGS: Healed distal metatarsal fractures. No acute fractures are seen explain the patient's medial bruising and pain. Soft tissue edema is noted. IMPRESSION: Soft tissue edema.  No acute bony abnormalities. Electronically Signed   By: Dorise Bullion III M.D   On: 04/19/2017 10:43   Medications: I have reviewed the patient's current medications. Scheduled Meds: . aspirin  325 mg Oral Daily  . atorvastatin  20 mg Oral QPM  . bisacodyl  10 mg Rectal Daily  . carbidopa-levodopa  1 tablet Oral TID  . doxepin  10 mg Oral QHS  . heparin  5,000 Units Subcutaneous Q8H  . loratadine  10 mg Oral Daily  . OLANZapine  10 mg Oral QHS  . primidone  50 mg Oral TID   Continuous Infusions: . sodium chloride 50 mL/hr at 04/20/17 0735   PRN Meds:.docusate sodium, lactulose, phenol   Assessment: Principal Problem:   Ileus ([place])  Kristen Oneill 82 y.o. female with abdominal distension, Imaging shows stool in the right colon , clinically she has no pain or tenderness, Likely a combination of stool in the Rt colon and gaseous distension in the remainder of the colon . At this time she says she is feeling better than yesterday , although X ray this am shows no significant difference .   Plan: 1. Suggest oral golytely 1/3rd of bottle  to see if it can help with a bowel movement and clear up the right colon . Limit narcotics, serial abdominal exam. Spoke to the nurse in charge of her, large bowel movement after a suppository earlier today. I would also suggest a repeat suppository or an enema. May benefit from rectal tube while we are treating with golytely.   If abdominal distension or pain worsens will need repeat CT scan. I would avoid neostigmine at this time as we have a mechanical issue with lots of stool in the right colon . If no better tomorrow would prefer colonoscopy decompression to take out as much air as possible. Keep NPO today except for golytely and meds  LOS: 1 day   Jonathon Bellows 04/20/2017, 9:45 AM

## 2017-04-20 NOTE — Plan of Care (Signed)
Problem: Education: Goal: Knowledge of [facility] Education information/materials will improve Outcome: Progressing    Problem: Bowel/Gastric: Goal: Will not experience complications related to bowel motility Outcome: Not Progressing Pt ordered GoLytely to encourage a BM. Pt is refusing the prep.

## 2017-04-20 NOTE — Plan of Care (Signed)
Problem: SLP Dysphagia Goals Goal: Misc Dysphagia Goal Pt will safely tolerate po diet of least restrictive consistency w/ no overt s/s of aspiration noted by Staff/pt/family x3 sessions.    

## 2017-04-20 NOTE — Progress Notes (Signed)
Placed on room air. Placed patient on overnight pulse ox test.

## 2017-04-20 NOTE — Evaluation (Signed)
Clinical/Bedside Swallow Evaluation Patient Details  Name: Kristen Oneill MRN: [id number] Date of Birth: 11-05-1934  Today's Date: 04/20/2017 Time: SLP Start Time (ACUTE ONLY): 1150 SLP Stop Time (ACUTE ONLY): 1240 SLP Time Calculation (min) (ACUTE ONLY): 50 min  Past Medical History:  Past Medical History:  Diagnosis Date  . Bipolar 1 disorder ([place])   . Parkinson disease [facility])    Past Surgical History:  Past Surgical History:  Procedure Laterality Date  . ABDOMINAL HYSTERECTOMY    . COLON SURGERY     HPI:  Pt is a 82 y.o. female with a known history of Bipolar and Parkinson's Disease- lives in [place], wheelchair bound and have 24 hr care taker for 10 yrs, no dementia, able to eat on her own. Evacuated and now in assisted living facility since last week due to [facility]. Came to ER 3 days ago for abdominal distention and pain; found to have colonic distention and stool, sent home. Came today again with worsening distention and pain. XRAY confirmed Ileus. Pt is NPO per GI to take oral liquid medication for tx at this time.    Assessment / Plan / Recommendation Clinical Impression  Pt appears to be at reduced risk for aspiration when following general aspiration precautions; no gross oropharyngeal phase dysphagia noted at this session though only a few trials were given d/t restricted po status per GI. Pt does exhibit audible swallows when drinking thin liquids via cup and/or straw - there did not appear to be any distress w/ swallowing. Encouraged smaller, single sips, slowly to reduce any risk for aspiration. Vocal quality remained clear; no coughing or throat clearing. Oral phase appeared wfl w/ bolus management. Due to pt's overall declined medical status/weakness and baseline neurological deficits (Parkinson's Disease), would recommend full supervision/monitoring w/ any po trials and aspiration precautions. ST services will f/u w/ pt's status for toleration of diet and w/ trials as  diet consistency can upgrade to solids again.  SLP Visit Diagnosis: Dysphagia, pharyngoesophageal phase (R13.14) (any GI issue can impact other phases)    Aspiration Risk  Mild aspiration risk (d/t declined medical and Neurological baseline status)    Diet Recommendation  thin liquids; food consistency TBD by GI d/t Ileus; aspiration precautions; reflux precautions  Medication Administration: Whole meds with puree ((PILLS))    Other  Recommendations Recommended Consults:  (dietician f/u) Oral Care Recommendations: Oral care BID;Patient independent with oral care;Staff/trained caregiver to provide oral care   Follow up Recommendations None (TBD)      Frequency and Duration min 3x week  2 weeks       Prognosis Prognosis for Safe Diet Advancement: Fair (-Good) Barriers to Reach Goals: Severity of deficits      Swallow Study   General Date of Onset: 04/19/17 HPI: Pt is a 82 y.o. female with a known history of Bipolar and Parkinson's Disease- lives in [place], wheelchair bound and have 24 hr care taker for 10 yrs, no dementia, able to eat on her own. Evacuated and now in assisted living facility since last week due to [facility]. Came to ER 3 days ago for abdominal distention and pain; found to have colonic distention and stool, sent home. Came today again with worsening distention and pain. XRAY confirmed Ileus. Pt is NPO per GI to take oral liquid medication for tx at this time.  Type of Study: Bedside Swallow Evaluation Previous Swallow Assessment: none per pt report Diet Prior to this Study: Thin liquids (soft foods per pt report) Temperature Spikes Noted:  No (wbc 10.7) Respiratory Status: Room air History of Recent Intubation: No Behavior/Cognition: Alert;Cooperative;Pleasant mood;Distractible;Requires cueing Oral Cavity Assessment: Within Functional Limits Oral Care Completed by SLP: Recent completion by staff Oral Cavity - Dentition: Adequate natural dentition (missing  few) Vision: Functional for self-feeding Self-Feeding Abilities: Able to feed self;Needs assist;Needs set up;Total assist Patient Positioning: Upright in bed Baseline Vocal Quality: Normal;Low vocal intensity Volitional Cough: Strong Volitional Swallow: Able to elicit    Oral/Motor/Sensory Function Overall Oral Motor/Sensory Function: Within functional limits   Ice Chips Ice chips: Within functional limits Presentation: Spoon (fed; 2 trials)   Thin Liquid Thin Liquid: Within functional limits Presentation: Cup;Self Fed;Straw (3 trials w/ each) Other Comments: significant audible swallows each time    Nectar Thick Nectar Thick Liquid: Not tested   Honey Thick Honey Thick Liquid: Not tested   Puree Puree: Not tested   Solid   GO   Solid: Not tested    Functional Assessment Tool Used: clinical judgement Functional Limitations: Swallowing Swallow Current Status ([id number]): At least 1 percent but less than 20 percent impaired, limited or restricted Swallow Goal Status [phone]): At least 1 percent but less than 20 percent impaired, limited or restricted Swallow Discharge Status [phone]): At least 1 percent but less than 20 percent impaired, limited or restricted    Orinda Kenner, MS, CCC-SLP Watson,Katherine 04/20/2017,2:55 PM
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
## 2017-04-21 ENCOUNTER — Inpatient Hospital Stay: Payer: Medicare Other | Admitting: Anesthesiology

## 2017-04-21 ENCOUNTER — Encounter
Admission: EM | Disposition: A | Discharge status: Hospice | Payer: Self-pay | Source: Home / Self Care | Attending: Internal Medicine

## 2017-04-21 ENCOUNTER — Inpatient Hospital Stay: Payer: Medicare Other

## 2017-04-21 DIAGNOSIS — K567 Ileus, unspecified: Secondary | ICD-10-CM

## 2017-04-21 DIAGNOSIS — K598 Other specified functional intestinal disorders: Secondary | ICD-10-CM

## 2017-04-21 HISTORY — PX: COLONOSCOPY WITH PROPOFOL: SHX5780

## 2017-04-21 LAB — GLUCOSE, CAPILLARY
GLUCOSE-CAPILLARY: 165 mg/dL — AB (ref 65–99)
GLUCOSE-CAPILLARY: 50 mg/dL — AB (ref 65–99)
GLUCOSE-CAPILLARY: 71 mg/dL (ref 65–99)
GLUCOSE-CAPILLARY: 80 mg/dL (ref 65–99)
Glucose-Capillary: 42 mg/dL — CL (ref 65–99)
Glucose-Capillary: 76 mg/dL (ref 65–99)
Glucose-Capillary: 75 mg/dL (ref 65–99)
Glucose-Capillary: 66 mg/dL (ref 65–99)
Glucose-Capillary: 66 mg/dL (ref 65–99)
Glucose-Capillary: 59 mg/dL — ABNORMAL LOW (ref 65–99)
Glucose-Capillary: 88 mg/dL (ref 65–99)

## 2017-04-21 SURGERY — COLONOSCOPY WITH PROPOFOL
Anesthesia: General

## 2017-04-21 MED ORDER — DEXTROSE 50 % IV SOLN
INTRAVENOUS | Status: AC
  Administered 2017-04-21: 02:00:00
  Filled 2017-04-21: qty 50

## 2017-04-21 MED ORDER — LIDOCAINE HCL (CARDIAC) 20 MG/ML IV SOLN
INTRAVENOUS | Status: DC | PRN
  Administered 2017-04-21: 2 mL via INTRAVENOUS

## 2017-04-21 MED ORDER — CEFAZOLIN SODIUM 1 G IJ SOLR
INTRAMUSCULAR | Status: AC
Start: 2017-04-21 — End: 2017-04-21
  Filled 2017-04-21: qty 10

## 2017-04-21 MED ORDER — PHENYLEPHRINE HCL 10 MG/ML IJ SOLN
INTRAMUSCULAR | Status: DC | PRN
  Administered 2017-04-21 (×4): 100 ug via INTRAVENOUS

## 2017-04-21 MED ORDER — PHENYLEPHRINE HCL 10 MG/ML IJ SOLN
INTRAMUSCULAR | Status: AC
  Filled 2017-04-21: qty 1

## 2017-04-21 MED ORDER — ONDANSETRON HCL 4 MG/2ML IJ SOLN
4.0000 mg | Freq: Four times a day (QID) | INTRAMUSCULAR | Status: DC | PRN
  Administered 2017-04-21 – 2017-04-26 (×2): 4 mg via INTRAVENOUS
  Filled 2017-04-21 (×2): qty 2

## 2017-04-21 MED ORDER — DEXTROSE-NACL 5-0.9 % IV SOLN
INTRAVENOUS | Status: DC
  Administered 2017-04-21: 1000 mL via INTRAVENOUS
  Administered 2017-04-22 – 2017-04-24 (×3): via INTRAVENOUS
  Administered 2017-04-24: 1000 mL via INTRAVENOUS
  Administered 2017-04-25 – 2017-04-28 (×3): via INTRAVENOUS

## 2017-04-21 MED ORDER — PROPOFOL 10 MG/ML IV BOLUS
INTRAVENOUS | Status: DC | PRN
  Administered 2017-04-21 (×2): 10 mg via INTRAVENOUS

## 2017-04-21 MED ORDER — INFLUENZA VAC SPLIT HIGH-DOSE 0.5 ML IM SUSY
0.5000 mL | PREFILLED_SYRINGE | INTRAMUSCULAR | Status: AC
  Administered 2017-04-23: 0.5 mL via INTRAMUSCULAR
  Filled 2017-04-21 (×2): qty 0.5

## 2017-04-21 MED ORDER — PROPOFOL 10 MG/ML IV BOLUS
INTRAVENOUS | Status: AC
  Filled 2017-04-21: qty 20

## 2017-04-21 MED ORDER — DEXTROSE 50 % IV SOLN
INTRAVENOUS | Status: AC
  Filled 2017-04-21: qty 50

## 2017-04-21 MED ORDER — SODIUM CHLORIDE 0.9 % IV SOLN: INTRAVENOUS | Status: DC

## 2017-04-21 MED ORDER — LIDOCAINE HCL (PF) 2 % IJ SOLN
INTRAMUSCULAR | Status: AC
  Filled 2017-04-21: qty 2

## 2017-04-21 MED ORDER — PROPOFOL 500 MG/50ML IV EMUL
INTRAVENOUS | Status: DC | PRN
  Administered 2017-04-21: 120 ug/kg/min via INTRAVENOUS

## 2017-04-21 NOTE — Progress Notes (Signed)
SLP Cancellation Note  Patient Details Name: Kristen Oneill MRN: [id number] DOB: [birth year]   Cancelled treatment:       Reason Eval/Treat Not Completed: Patient at procedure or test/unavailable (chart reviewed; consulted NSG). Pt is NPO for GI procedure today. ST will f/u tomorrow w/ toleration of diet recommended by GI. NSG agreed.   Carolynn Sayers, SLP-Graduate Student Carolynn Sayers 04/21/2017, 11:09 AM   This information has been reviewed and agreed upon by this supervising clinician.  This patient note, response to treatment and overall treatment plan has been reviewed and this clinician agrees with the information provided.  04/21/17, 1:54 PM [place], MS, CCC-SLP

## 2017-04-21 NOTE — Op Note (Signed)
[facility] Patient Name: Kristen Oneill Procedure Date: 04/21/2017 11:29 AM MRN: [id number] Account #: [id number] Date of Birth: 1935/08/01 Admit Type: Inpatient Age: 82 Room: [facility] ENDO ROOM 1 Gender: Female Note Status: Finalized Procedure:            Colonoscopy Indications:          Decompression of acute non-toxic megacolon Providers:            Jonathon Bellows MD, MD Referring MD:         No Local Md, MD (Referring MD) Medicines:            Monitored Anesthesia Care Complications:        No immediate complications. Procedure:            Pre-Anesthesia Assessment:                       - ASA Grade Assessment: IV - A patient with severe                        systemic disease that is a constant threat to life.                       - Prior to the procedure, a History and Physical was                        performed, and patient medications, allergies and                        sensitivities were reviewed. The patient's tolerance of                        previous anesthesia was reviewed.                       - The risks and benefits of the procedure and the                        sedation options and risks were discussed with the                        patient. All questions were answered and informed                        consent was obtained.                       After obtaining informed consent, the colonoscope was                        passed under direct vision. Throughout the procedure,                        the patient's blood pressure, pulse, and oxygen                        saturations were monitored continuously. The                        Colonoscope was introduced through the anus and  advanced to the the transverse colon for evaluation.                        This was the intended extent. The colonoscopy was                        somewhat difficult due to poor endoscopic   visualization. The patient tolerated the procedure                        well. The quality of the bowel preparation was                        inadequate. Findings:      A large amount of stool was found in the sigmoid colon and in the       descending colon, interfering with visualization.      A scattered area of moderately erythematous mucosa was found in the       distal transverse colon. suggestive of ischemia      A 15 mm polypoid lesion was found in the sigmoid colon. The lesion was       semi-pedunculated. No bleeding was present. Biopsies were taken with a       cold forceps for histology. The mass is non obstruting Impression:           - Preparation of the colon was inadequate.                       - Stool in the sigmoid colon and in the descending                        colon.                       - Erythematous mucosa in the distal transverse colon.                       - Rule out malignancy, polypoid lesion in the sigmoid                        colon. Biopsied. Recommendation:       - Return patient to hospital ward for ongoing care.                       - Clear liquid diet today.                       - 1. Rectal tube has been placed - please connect to                        intermittent suction                       2. Serial abdominal exam                       3. F/u biopsies of rectal mass                       4. Suggest gastrograffin enema to r/o any obstructing  lesions                       5. since this is a chronic issue and there is a                        possibility of the mass being neoplasm , probably a                        palliative diverting colostomy would be an option.                       6. From the medical point of view only options are                        continue suction from rectal tube, correct                        electrolytes, Neostigmine is an option in the abscence                        of any  obstruction but if the issue is recurrent am not                        sure if this a long term option.                       7. Serial abdominal exam , ischemic changes likely                        secondary to luminal distension Procedure Code(s):    --- Professional ---                       [phone], 52, Colonoscopy, flexible; with biopsy, single                        or multiple Diagnosis Code(s):    --- Professional ---                       K63.89, Other specified diseases of intestine                       D49.0, Neoplasm of unspecified behavior of digestive                        system                       K59.39, Other megacolon CPT copyright 2016 [employer]. All rights reserved. The codes documented in this report are preliminary and upon coder review may  be revised to meet current compliance requirements. Jonathon Bellows, MD Jonathon Bellows MD, MD 04/21/2017 12:46:01 PM This report has been signed electronically. Number of Addenda: 0 Note Initiated On: 04/21/2017 11:29 AM Total Procedure Duration: 0 hours 57 minutes 27 seconds       [facility]

## 2017-04-21 NOTE — Progress Notes (Signed)
Kristen Bellows MD, MRCP(U.K) [address]  [place]  [place], [place] [address]  Main: [place] is being followed for ileus/constipation    Subjective: She says she feels fine, no abdominal pain    Objective: Vital signs in last 24 hours: Vitals:   04/20/17 0520 04/20/17 1241 04/20/17 2312 04/21/17 0503  BP: 136/78 118/65 114/62 (!) 149/74  Pulse: (!) 114 92 87 94  Resp: (!) 22 18 18  (!) 24  Temp: 98.4 F (36.9 C) (!) 97.5 F (36.4 C) 97.9 F (36.6 C) 97.8 F (36.6 C)  TempSrc: Oral Oral Oral Oral  SpO2: 96% 97% 95% 96%  Weight:      Height:       Weight change:   Intake/Output Summary (Last 24 hours) at 04/21/17 0858 Last data filed at 04/21/17 0503  Gross per 24 hour  Intake           548.81 ml  Output              950 ml  Net          -401.19 ml     Exam: Heart:: Regular rate and rhythm, S1S2 present or without murmur or extra heart sounds Lungs: normal, clear to auscultation and clear to auscultation and percussion Abdomen: markedly distended, no tenderness, no guarding or rigidity BS+   Lab Results: @LABTEST2 @ Micro Results: Recent Results (from the past 240 hour(s))  MRSA PCR Screening     Status: None   Collection Time: 04/19/17  3:11 PM  Result Value Ref Range Status   MRSA by PCR NEGATIVE NEGATIVE Final    Comment:        The GeneXpert MRSA Assay (FDA approved for NASAL specimens only), is one component of a comprehensive MRSA colonization surveillance program. It is not intended to diagnose MRSA infection nor to guide or monitor treatment for MRSA infections.    Studies/Results: Dg Chest 2 View  Result Date: 04/19/2017 CLINICAL DATA:  Leg pain.  Abdominal distention. EXAM: CHEST  2 VIEW COMPARISON:  April 15, 2017 FINDINGS: Mild atelectasis in the left base. No other changes or acute abnormalities. IMPRESSION: Mild atelectasis in the left base. Electronically Signed   By: Dorise Bullion III M.D   On:  04/19/2017 10:48   Dg Abdomen 1 View  Result Date: 04/19/2017 CLINICAL DATA:  Abdominal distension. EXAM: ABDOMEN - 1 VIEW COMPARISON:  April 16, 2017 CT scan FINDINGS: Fecal loading is seen in the ascending colon. Dilated loops of bowel in the abdomen are thought to largely represent colon, with improvement of sigmoid dilatation since April 16, 2017. No free air or portal venous gas. No pneumatosis. IMPRESSION: Marked colonic distention remains, possibly representing ileus. Sigmoid dilatation has improved in the interval. Fecal loading in the ascending colon. Electronically Signed   By: Dorise Bullion III M.D   On: 04/19/2017 10:40   US Venous Img Lower Bilateral  Result Date: 04/19/2017 CLINICAL DATA:  Leg pain starting this morning.  Chronic swelling. EXAM: BILATERAL LOWER EXTREMITY VENOUS DOPPLER ULTRASOUND TECHNIQUE: Gray-scale sonography with graded compression, as well as color Doppler and duplex ultrasound were performed to evaluate the lower extremity deep venous systems from the level of the common femoral vein and including the common femoral, femoral, profunda femoral, popliteal and calf veins including the posterior tibial, peroneal and gastrocnemius veins when visible. The superficial great saphenous vein was also interrogated. Spectral Doppler was utilized to evaluate flow at rest and  with distal augmentation maneuvers in the common femoral, femoral and popliteal veins. COMPARISON:  None. FINDINGS: RIGHT LOWER EXTREMITY Common Femoral Vein: No evidence of thrombus. Normal compressibility, respiratory phasicity and response to augmentation. Saphenofemoral Junction: No evidence of thrombus. Normal compressibility and flow on color Doppler imaging. Profunda Femoral Vein: No evidence of thrombus. Normal compressibility and flow on color Doppler imaging. Femoral Vein: No evidence of thrombus. Normal compressibility, respiratory phasicity and response to augmentation. Popliteal Vein: No  evidence of thrombus. Normal compressibility, respiratory phasicity and response to augmentation. Calf Veins: No evidence of thrombus. Normal compressibility and flow on color Doppler imaging. However, please note that the right peroneal vein was not well visualized. Superficial Great Saphenous Vein: No evidence of thrombus. Normal compressibility and flow on color Doppler imaging. Venous Reflux:  None. Other Findings:  None. LEFT LOWER EXTREMITY Common Femoral Vein: No evidence of thrombus. Normal compressibility, respiratory phasicity and response to augmentation. Saphenofemoral Junction: No evidence of thrombus. Normal compressibility and flow on color Doppler imaging. Profunda Femoral Vein: No evidence of thrombus. Normal compressibility and flow on color Doppler imaging. Femoral Vein: No evidence of thrombus. Normal compressibility, respiratory phasicity and response to augmentation. Popliteal Vein: No evidence of thrombus. Normal compressibility, respiratory phasicity and response to augmentation. Calf Veins: Not well visualized on today's exam. Superficial Great Saphenous Vein: No evidence of thrombus. Normal compressibility and flow on color Doppler imaging. Venous Reflux:  None. Other Findings:  None. IMPRESSION: 1. No evidence of DVT within either lower extremity. 2. Poor visualization of the calf veins on the left, and of the right peroneal vein, attributed to small caliber of veins. Electronically Signed   By: Van Clines M.D.   On: 04/19/2017 14:10   Dg Abd 2 Views  Result Date: 04/21/2017 CLINICAL DATA:  Abdominal distention. EXAM: ABDOMEN - 2 VIEW COMPARISON:  04/20/2017; 04/15/2017; CT abdomen pelvis - 04/16/2017 FINDINGS: Re- demonstrated marked gas distention of multiple loops of large and small bowel. No definite evidence of volvulus. No definite pneumoperitoneum, pneumatosis or portal venous gas. Several phleboliths overlie the lower pelvis bilaterally. Otherwise, no definitive  abnormal intra- abdominal calcifications. No definite acute osseous abnormalities. IMPRESSION: Re- demonstrated findings most suggestive of severe adynamic ileus. Electronically Signed   By: Sandi Mariscal M.D.   On: 04/21/2017 08:29   Dg Abd Acute W/chest  Result Date: 04/20/2017 CLINICAL DATA:  Ogilvie's syndrome EXAM: DG ABDOMEN ACUTE W/ 1V CHEST COMPARISON:  04/19/2017 FINDINGS: Heart is normal size.  Lungs are clear.  No effusions. Marked gaseous distention of the colon again noted, not significantly changed. Prior CABG. No organomegaly or free air. IMPRESSION: Continued marked gaseous distention of the colon, unchanged. No free air. No acute cardiopulmonary disease. Electronically Signed   By: Rolm Baptise M.D.   On: 04/20/2017 07:48   Dg Abd Portable 1 View  Result Date: 04/19/2017 CLINICAL DATA:  NG tube placement EXAM: PORTABLE ABDOMEN - 1 VIEW COMPARISON:  None. FINDINGS: The NG tube terminates at midline, likely within the gastric antrum. Continued colonic distention. Probable atelectasis in the left base. IMPRESSION: The NG tube is thought to terminate in the gastric antrum. Continued colonic distention. Electronically Signed   By: Dorise Bullion III M.D   On: 04/19/2017 12:50   Dg Foot Complete Right  Result Date: 04/19/2017 CLINICAL DATA:  Leg pain and abdominal distention. EXAM: RIGHT FOOT COMPLETE - 3+ VIEW COMPARISON:  None. FINDINGS: Healed distal metatarsal fractures. No acute fractures are seen explain the patient's medial bruising  and pain. Soft tissue edema is noted. IMPRESSION: Soft tissue edema.  No acute bony abnormalities. Electronically Signed   By: Dorise Bullion III M.D   On: 04/19/2017 10:43   Medications: I have reviewed the patient's current medications. Scheduled Meds: . aspirin  325 mg Oral Daily  . atorvastatin  20 mg Oral QPM  . bisacodyl  10 mg Rectal Daily  . carbidopa-levodopa  1 tablet Oral TID  . doxepin  10 mg Oral QHS  . heparin  5,000 Units Subcutaneous  Q8H  . loratadine  10 mg Oral Daily  . OLANZapine  10 mg Oral QHS  . primidone  50 mg Oral TID   Continuous Infusions: . sodium chloride    . dextrose 5 % and 0.9% NaCl     PRN Meds:.docusate sodium, lactulose, phenol   Assessment: Principal Problem:   Ileus (HCC) Active Problems:   Pressure injury of skin   Cindie Lachman 82 y.o. female with abdominal distension, Imaging shows stool in the right colon , clinically she has no pain or tenderness, Likely a combination of stool in the Rt colon and gaseous distension in the remainder of the colon. Not improved since yesterday and hence will proceed with colonoscopy decompression and attempt placement of decompression tube.   I have discussed alternative options, risks & benefits,  which include, but are not limited to, bleeding, infection, perforation,respiratory complication & drug reaction.  The patient agrees with this plan & written consent will be obtained.       LOS: 2 days   Kristen Oneill 04/21/2017, 8:58 AM

## 2017-04-21 NOTE — H&P (Signed)
Jonathon Bellows MD [address], [place], [place] [address] Phone: [phone] Fax : [phone]  Primary Care Physician:  Patient, No Pcp Per Primary Gastroenterologist:  Dr. Jonathon Bellows   Pre-Procedure History & Physical: HPI:  Kristen Oneill is a 82 y.o. female is here for an colonoscopy.   Past Medical History:  Diagnosis Date  . Bipolar 1 disorder ([place])   . Parkinson disease [facility])     Past Surgical History:  Procedure Laterality Date  . ABDOMINAL HYSTERECTOMY    . COLON SURGERY      Prior to Admission medications   Medication Sig Start Date End Date Taking? Authorizing Provider  aspirin 325 MG EC tablet Take 325 mg by mouth daily.   Yes [provider]  atorvastatin (LIPITOR) 20 MG tablet Take 20 mg by mouth daily.   Yes [provider]  carbidopa-levodopa (SINEMET IR) 25-100 MG tablet Take 1 tablet by mouth 3 (three) times daily.   Yes [provider]  cetirizine (ZYRTEC ALLERGY) 10 MG tablet Take 1 tablet (10 mg total) by mouth daily. 04/16/17  Yes Gregor Hams, MD  Cholecalciferol (VITAMIN D3) 2000 units CHEW Chew 1 capsule by mouth daily.   Yes [provider]  divalproex (DEPAKOTE ER) 500 MG 24 hr tablet Take 1,000 mg by mouth daily.   Yes [provider]  docusate sodium (COLACE) 250 MG capsule Take 750 mg by mouth at bedtime.   Yes [provider]  doxepin (SINEQUAN) 10 MG capsule Take 10 mg by mouth at bedtime.   Yes [provider]  fluticasone (FLONASE) 50 MCG/ACT nasal spray Place 1 spray into both nostrils daily. 04/16/17 04/26/17 Yes Gregor Hams, MD  folic acid (FOLVITE) 1 MG tablet Take 1 mg by mouth daily.   Yes [provider]  polyethylene glycol (MIRALAX / GLYCOLAX) packet Take 17 g by mouth daily as needed.   Yes [provider]  primidone (MYSOLINE) 50 MG tablet Take 50 mg by mouth 3 (three) times daily.   Yes [provider]  SILENOR 3 MG TABS Take 3 mg  by mouth daily.   Yes [provider]  simethicone (GAS-X) 80 MG chewable tablet Chew 1 tablet (80 mg total) by mouth 4 (four) times daily as needed for flatulence. 04/16/17 05/01/17 Yes Harvest Dark, MD    Allergies as of 04/19/2017 - Review Complete 04/19/2017  Allergen Reaction Noted  . Penicillins  04/15/2017    Family History  Problem Relation Age of Onset  . Bipolar disorder Brother     Social History   Social History  . Marital status: Widowed    Spouse name: N/A  . Number of children: N/A  . Years of education: N/A   Occupational History  . Not on file.   Social History Main Topics  . Smoking status: Never Smoker  . Smokeless tobacco: Never Used  . Alcohol use Not on file  . Drug use: Unknown  . Sexual activity: Not on file   Other Topics Concern  . Not on file   Social History Narrative  . No narrative on file    Review of Systems: See HPI, otherwise negative ROS  Physical Exam: BP (!) 149/74 (BP Location: Left Arm)   Pulse 94   Temp 97.8 F (36.6 C) (Oral)   Resp (!) 24   Ht 5\' 6"  (1.676 m)   Wt 167 lb (75.8 kg)   SpO2 96%   BMI 26.95 kg/m  General:  Alert,  pleasant and cooperative in NAD Head:  Normocephalic and atraumatic. Neck:  Supple; no masses or thyromegaly. Lungs:  Clear throughout to auscultation.    Heart:  Regular rate and rhythm. Abdomen: Markedly distended, tense, mild generalized tenderness ,limited al bowel sounds, without guarding, and without rebound.   Neurologic:  Alert and  oriented x1-2 ;  grossly normal neurologically.  Impression/Plan: Kristen Oneill is here for an colonoscopy to be performed for decompression and placement of decompression tube    I attempted to contact family for consent , multiple attempts made by staff and messages have been left. Will have to proceed with procedure as a medical necessity .   Jonathon Bellows, MD  04/21/2017, 11:19 AM

## 2017-04-21 NOTE — Progress Notes (Signed)
Patient's blood sugar decreased to 50.  Orange juice was given and 15 minutes later, blood sugar was 42.  25 mL of D50 IV was given, 15 minutes later it was 76 then it went down to 71.  Continuing to monitor patient.  Phoebe Sharps N   04/21/2017  2:18 AM

## 2017-04-21 NOTE — Progress Notes (Signed)
Discussed with radiology - on second look they do see the sigmoid mass that I biopsied but it is again not the cause of the obstruction . They do note that the recto sigmoid area is inflammed on the old CT scan which was again the area which was slightly hard for [place] to go past .   Due to biopsies taken and inflammation , instilling contrast and increasing the pressure can be risky and cause perforation and hence recommended only if it would change the management significantly .   Plan  1. Watch her next 24-48 hours- await biopsies 2. No imaging today  Dr Jonathon Bellows MD,MRCP [facility]) Gastroenterology/Hepatology Pager: [phone] ]

## 2017-04-21 NOTE — Progress Notes (Signed)
Flexiseal Rectal tube placed during colon decompression. 45 ml water inserted in balloon.

## 2017-04-21 NOTE — Transfer of Care (Signed)
Immediate Anesthesia Transfer of Care Note  Patient: Kristen Oneill  Procedure(s) Performed: Procedure(s): COLONOSCOPY WITH PROPOFOL (N/A)  Patient Location: PACU  Anesthesia Type:General  Level of Consciousness: awake  Airway & Oxygen Therapy: Patient Spontanous Breathing and Patient connected to nasal cannula oxygen  Post-op Assessment: Report given to RN and Post -op Vital signs reviewed and stable  Post vital signs: Reviewed  Last Vitals:  Vitals:   04/21/17 0503 04/21/17 1123  BP: (!) 149/74 (!) 146/73  Pulse: 94 87  Resp: (!) 24   Temp: 36.6 C (!) 35.8 C  SpO2: 96% 98%    Last Pain:  Vitals:   04/21/17 1123  TempSrc: Tympanic  PainSc:          Complications: No apparent anesthesia complications

## 2017-04-21 NOTE — Progress Notes (Signed)
Called patient son Aashna Matson to obtain consent for colonoscopy and was not able to contact him by phone, message left for him to call writer back.

## 2017-04-21 NOTE — Anesthesia Post-op Follow-up Note (Signed)
Anesthesia QCDR form completed.        

## 2017-04-21 NOTE — Progress Notes (Signed)
Kristen Oneill ID: Kristen Oneill, female   DOB: 1935/04/23, 82 y.o.   MRN: [id number]  [employer] PROGRESS NOTE  Kristen Oneill WUJ:[id number] DOB: [birth year] DOA: 04/19/2017 PCP: Kristen Oneill, No Pcp Per  HPI/Subjective: Kristen Oneill seen right after colonoscopy. She states she doesn't feel too good but didn't elaborate.  She states not having any abdominal pain.  Objective: Vitals:   04/21/17 1315 04/21/17 1339  BP: (!) 143/69 (!) 150/71  Pulse: 86 82  Resp: 15 18  Temp:  97.9 F (36.6 C)  SpO2: 99% 98%    Filed Weights   04/19/17 0933 04/21/17 1123  Weight: 75.8 kg (167 lb) 75.8 kg (167 lb)    ROS: Review of Systems  Unable to perform ROS: Acuity of condition  Gastrointestinal: Negative for abdominal pain.   Exam: Physical Exam  HENT:  Nose: No mucosal edema.  Mouth/Throat: Uvula swelling present. No oropharyngeal exudate or posterior oropharyngeal edema.  Eyes: Pupils are equal, round, and reactive to light. Conjunctivae, EOM and lids are normal.  Neck: No JVD present. Carotid bruit is not present. No edema present. No thyroid mass and no thyromegaly present.  Cardiovascular: S1 normal and S2 normal.  Exam reveals no gallop.   No murmur heard. Pulses:      Dorsalis pedis pulses are 2+ on the right side, and 2+ on the left side.  Respiratory: No respiratory distress. She has decreased breath sounds in the right lower field and the left lower field. She has no wheezes. She has no rhonchi. She has no rales.  GI: Soft. Bowel sounds are normal. There is no tenderness.  Musculoskeletal:       Right ankle: She exhibits swelling.       Left ankle: She exhibits swelling.  Lymphadenopathy:    She has no cervical adenopathy.  Neurological: She is alert. No cranial nerve deficit.  Skin: Skin is warm. Nails show no clubbing.  Stage I decubitus ulcer on buttock  Psychiatric: She has a normal mood and affect.      Data Reviewed: Basic Metabolic Panel:  Recent Labs Lab 04/15/17 2344  04/19/17 0948 04/20/17 0315  NA 134* 135 137  K 3.9 3.2* 4.0  CL 94* 95* 98*  CO2 32 31 30  GLUCOSE 89 91 104*  BUN 20 13 13   CREATININE 0.58 0.39* 0.39*  CALCIUM 9.2 9.3 9.1  MG  --  1.8  --    Liver Function Tests:  Recent Labs Lab 04/15/17 2344 04/19/17 0948  AST 15 16  ALT 6* 8*  ALKPHOS 71 66  BILITOT 0.4 0.3  PROT 7.8 7.1  ALBUMIN 3.8 3.4*    Recent Labs Lab 04/15/17 2344 04/19/17 0948  LIPASE 23 16   CBC:  Recent Labs Lab 04/15/17 2344 04/19/17 0948 04/20/17 0315  WBC 7.1 9.8 10.7  NEUTROABS 4.6 7.7*  --   HGB 13.9 13.2 14.2  HCT 41.2 38.1 40.8  MCV 93.7 90.8 93.4  PLT 215 209 204   Cardiac Enzymes:  Recent Labs Lab 04/19/17 0948  TROPONINI <0.03   BNP (last 3 results)  Recent Labs  04/19/17 0948  BNP 22.0     CBG:  Recent Labs Lab 04/21/17 0042 04/21/17 0114 04/21/17 0200 04/21/17 0215 04/21/17 0459  GLUCAP 50* 42* 76 71 75    Recent Results (from the past 240 hour(s))  MRSA PCR Screening     Status: None   Collection Time: 04/19/17  3:11 PM  Result Value Ref Range Status   MRSA  by PCR NEGATIVE NEGATIVE Final    Comment:        The GeneXpert MRSA Assay (FDA approved for NASAL specimens only), is one component of a comprehensive MRSA colonization surveillance program. It is not intended to diagnose MRSA infection nor to guide or monitor treatment for MRSA infections.      Studies: US Venous Img Lower Bilateral  Result Date: 04/19/2017 CLINICAL DATA:  Leg pain starting this morning.  Chronic swelling. EXAM: BILATERAL LOWER EXTREMITY VENOUS DOPPLER ULTRASOUND TECHNIQUE: Gray-scale sonography with graded compression, as well as color Doppler and duplex ultrasound were performed to evaluate the lower extremity deep venous systems from the level of the common femoral vein and including the common femoral, femoral, profunda femoral, popliteal and calf veins including the posterior tibial, peroneal and gastrocnemius veins  when visible. The superficial great saphenous vein was also interrogated. Spectral Doppler was utilized to evaluate flow at rest and with distal augmentation maneuvers in the common femoral, femoral and popliteal veins. COMPARISON:  None. FINDINGS: RIGHT LOWER EXTREMITY Common Femoral Vein: No evidence of thrombus. Normal compressibility, respiratory phasicity and response to augmentation. Saphenofemoral Junction: No evidence of thrombus. Normal compressibility and flow on color Doppler imaging. Profunda Femoral Vein: No evidence of thrombus. Normal compressibility and flow on color Doppler imaging. Femoral Vein: No evidence of thrombus. Normal compressibility, respiratory phasicity and response to augmentation. Popliteal Vein: No evidence of thrombus. Normal compressibility, respiratory phasicity and response to augmentation. Calf Veins: No evidence of thrombus. Normal compressibility and flow on color Doppler imaging. However, please note that the right peroneal vein was not well visualized. Superficial Great Saphenous Vein: No evidence of thrombus. Normal compressibility and flow on color Doppler imaging. Venous Reflux:  None. Other Findings:  None. LEFT LOWER EXTREMITY Common Femoral Vein: No evidence of thrombus. Normal compressibility, respiratory phasicity and response to augmentation. Saphenofemoral Junction: No evidence of thrombus. Normal compressibility and flow on color Doppler imaging. Profunda Femoral Vein: No evidence of thrombus. Normal compressibility and flow on color Doppler imaging. Femoral Vein: No evidence of thrombus. Normal compressibility, respiratory phasicity and response to augmentation. Popliteal Vein: No evidence of thrombus. Normal compressibility, respiratory phasicity and response to augmentation. Calf Veins: Not well visualized on today's exam. Superficial Great Saphenous Vein: No evidence of thrombus. Normal compressibility and flow on color Doppler imaging. Venous Reflux:  None.  Other Findings:  None. IMPRESSION: 1. No evidence of DVT within either lower extremity. 2. Poor visualization of the calf veins on the left, and of the right peroneal vein, attributed to small caliber of veins. Electronically Signed   By: Van Clines M.D.   On: 04/19/2017 14:10   Dg Abd 2 Views  Result Date: 04/21/2017 CLINICAL DATA:  Abdominal distention. EXAM: ABDOMEN - 2 VIEW COMPARISON:  04/20/2017; 04/15/2017; CT abdomen pelvis - 04/16/2017 FINDINGS: Re- demonstrated marked gas distention of multiple loops of large and small bowel. No definite evidence of volvulus. No definite pneumoperitoneum, pneumatosis or portal venous gas. Several phleboliths overlie the lower pelvis bilaterally. Otherwise, no definitive abnormal intra- abdominal calcifications. No definite acute osseous abnormalities. IMPRESSION: Re- demonstrated findings most suggestive of severe adynamic ileus. Electronically Signed   By: Sandi Mariscal M.D.   On: 04/21/2017 08:29   Dg Abd Acute W/chest  Result Date: 04/20/2017 CLINICAL DATA:  Ogilvie's syndrome EXAM: DG ABDOMEN ACUTE W/ 1V CHEST COMPARISON:  04/19/2017 FINDINGS: Heart is normal size.  Lungs are clear.  No effusions. Marked gaseous distention of the colon again noted, not significantly  changed. Prior CABG. No organomegaly or free air. IMPRESSION: Continued marked gaseous distention of the colon, unchanged. No free air. No acute cardiopulmonary disease. Electronically Signed   By: Rolm Baptise M.D.   On: 04/20/2017 07:48    Scheduled Meds: . [MAR Hold] aspirin  325 mg Oral Daily  . [MAR Hold] atorvastatin  20 mg Oral QPM  . [MAR Hold] bisacodyl  10 mg Rectal Daily  . [MAR Hold] carbidopa-levodopa  1 tablet Oral TID  . [MAR Hold] doxepin  10 mg Oral QHS  . [MAR Hold] heparin  5,000 Units Subcutaneous Q8H  . [MAR Hold] Influenza vac split quadrivalent PF  0.5 mL Intramuscular Tomorrow-1000  . [MAR Hold] loratadine  10 mg Oral Daily  . [MAR Hold] OLANZapine  10 mg  Oral QHS  . [MAR Hold] primidone  50 mg Oral TID   Continuous Infusions: . sodium chloride    . dextrose 5 % and 0.9% NaCl      Assessment/Plan:  1. 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 Kristen Oneill taken for colonoscopy today. Biopsy of a large polyp. Rectal tube placed for decompression. As per the nurse the abdomen is less tense now than prior to the procedure. 2. Parkinson's disease on Sinemet 3. Bipolar disorder on olanzapine 4. Hyperlipidemia unspecified on atorvastatin 5. Stage I decubitus ulcer on buttock. Change positions frequently 6. Appreciate swallow evaluation 7. Uvula swollen. Continue Claritin. 8. Suspect sleep apnea overnight ximetry.  Code Status:     Code Status Orders        Start     Ordered   04/19/17 1419  Full code  Continuous     04/19/17 1418    Code Status History    Date Active Date Inactive Code Status Order ID Comments User Context   This Kristen Oneill has a current code status but no historical code status.     Family Communication: left message for someone on phone Disposition Plan: to be determined based on clinical course on whether or not she has further bowel movements.  Consultants:  General surgery  Gastroenterology  Time spent: 26 minutes  [place], [place]

## 2017-04-21 NOTE — Anesthesia Preprocedure Evaluation (Addendum)
Anesthesia Evaluation  Patient identified by MRN, date of birth, ID band Patient awake    Reviewed: Allergy & Precautions  History of Anesthesia Complications Negative for: history of anesthetic complications  Airway Mallampati: II       Dental  (+) Upper Dentures, Lower Dentures   Pulmonary neg pulmonary ROS,     + decreased breath sounds      Cardiovascular Exercise Tolerance: Poor hypertension,  Rhythm:Regular Rate:Tachycardia     Neuro/Psych Bipolar Disorder Hx of Parkinson's    GI/Hepatic negative GI ROS, Neg liver ROS, (+)     (-) substance abuse  , SBO   Endo/Other  Morbid obesity  Renal/GU negative Renal ROS     Musculoskeletal   Abdominal (+) + obese,   Peds negative pediatric ROS (+)  Hematology   Anesthesia Other Findings   Reproductive/Obstetrics                            Anesthesia Physical Anesthesia Plan  ASA: IV and emergent  Anesthesia Plan: General   Post-op Pain Management:    Induction: Intravenous  PONV Risk Score and Plan: 0  Airway Management Planned: Natural Airway and Nasal Cannula  Additional Equipment:   Intra-op Plan:   Post-operative Plan:   Informed Consent: I have reviewed the patients History and Physical, chart, labs and discussed the procedure including the risks, benefits and alternatives for the proposed anesthesia with the patient or authorized representative who has indicated his/her understanding and acceptance.     Plan Discussed with: Surgeon  Anesthesia Plan Comments:         Anesthesia Quick Evaluation

## 2017-04-21 NOTE — Anesthesia Postprocedure Evaluation (Signed)
Anesthesia Post Note  Patient: Kristen Oneill  Procedure(s) Performed: Procedure(s) (LRB): COLONOSCOPY WITH PROPOFOL (N/A)  Patient location during evaluation: PACU Anesthesia Type: General Level of consciousness: awake Pain management: pain level controlled Vital Signs Assessment: post-procedure vital signs reviewed and stable Respiratory status: nonlabored ventilation Cardiovascular status: stable Anesthetic complications: no     Last Vitals:  Vitals:   04/21/17 1315 04/21/17 1339  BP: (!) 143/69 (!) 150/71  Pulse: 86 82  Resp: 15 18  Temp:  36.6 C  SpO2: 99% 98%    Last Pain:  Vitals:   04/21/17 1339  TempSrc: Oral  PainSc:                  VAN STAVEREN,[name]

## 2017-04-22 ENCOUNTER — Encounter: Payer: Self-pay | Admitting: Gastroenterology

## 2017-04-22 DIAGNOSIS — K5981 Ogilvie syndrome: Secondary | ICD-10-CM

## 2017-04-22 DIAGNOSIS — K598 Other specified functional intestinal disorders: Secondary | ICD-10-CM

## 2017-04-22 LAB — CBC
HEMATOCRIT: 34.9 % — AB (ref 35.0–47.0)
Hemoglobin: 12.3 g/dL (ref 12.0–16.0)
MCH: 32.5 pg (ref 26.0–34.0)
MCHC: 35.2 g/dL (ref 32.0–36.0)
MCV: 92.3 fL (ref 80.0–100.0)
Platelets: 239 10*3/uL (ref 150–440)
RBC: 3.78 MIL/uL — AB (ref 3.80–5.20)
RDW: 13.7 % (ref 11.5–14.5)
WBC: 5.8 10*3/uL (ref 3.6–11.0)

## 2017-04-22 LAB — GLUCOSE, CAPILLARY
GLUCOSE-CAPILLARY: 114 mg/dL — AB (ref 65–99)
GLUCOSE-CAPILLARY: 108 mg/dL — AB (ref 65–99)
GLUCOSE-CAPILLARY: 117 mg/dL — AB (ref 65–99)
Glucose-Capillary: 149 mg/dL — ABNORMAL HIGH (ref 65–99)

## 2017-04-22 LAB — BASIC METABOLIC PANEL
ANION GAP: 7 (ref 5–15)
BUN: 8 mg/dL (ref 6–20)
CHLORIDE: 102 mmol/L (ref 101–111)
CO2: 29 mmol/L (ref 22–32)
Calcium: 8.1 mg/dL — ABNORMAL LOW (ref 8.9–10.3)
Creatinine, Ser: 0.35 mg/dL — ABNORMAL LOW (ref 0.44–1.00)
Glucose, Bld: 123 mg/dL — ABNORMAL HIGH (ref 65–99)
POTASSIUM: 3 mmol/L — AB (ref 3.5–5.1)
SODIUM: 138 mmol/L (ref 135–145)

## 2017-04-22 LAB — SURGICAL PATHOLOGY

## 2017-04-22 MED ORDER — OXYCODONE HCL 5 MG PO TABS
5.0000 mg | ORAL_TABLET | Freq: Four times a day (QID) | ORAL | Status: DC | PRN
  Filled 2017-04-22: qty 1

## 2017-04-22 MED ORDER — MAGNESIUM OXIDE 400 (241.3 MG) MG PO TABS
400.0000 mg | ORAL_TABLET | Freq: Two times a day (BID) | ORAL | Status: DC
  Administered 2017-04-22 – 2017-04-26 (×9): 400 mg via ORAL
  Filled 2017-04-22 (×10): qty 1

## 2017-04-22 MED ORDER — LACTULOSE 10 GM/15ML PO SOLN
30.0000 g | Freq: Two times a day (BID) | ORAL | Status: DC
  Administered 2017-04-22: 30 g via ORAL
  Filled 2017-04-22: qty 60

## 2017-04-22 MED ORDER — ACETAMINOPHEN 325 MG PO TABS
650.0000 mg | ORAL_TABLET | Freq: Four times a day (QID) | ORAL | Status: DC | PRN
  Administered 2017-04-22: 650 mg via ORAL
  Filled 2017-04-22: qty 2

## 2017-04-22 MED ORDER — LACTULOSE 10 GM/15ML PO SOLN
30.0000 g | Freq: Three times a day (TID) | ORAL | Status: DC
  Administered 2017-04-22 – 2017-04-26 (×13): 30 g via ORAL
  Filled 2017-04-22 (×13): qty 60

## 2017-04-22 MED ORDER — POTASSIUM CHLORIDE CRYS ER 20 MEQ PO TBCR
20.0000 meq | EXTENDED_RELEASE_TABLET | Freq: Two times a day (BID) | ORAL | Status: DC
  Administered 2017-04-22 – 2017-04-26 (×8): 20 meq via ORAL
  Filled 2017-04-22 (×10): qty 1

## 2017-04-22 MED ORDER — LACTULOSE 10 GM/15ML PO SOLN: 30.0000 g | Freq: Three times a day (TID) | ORAL | Status: DC

## 2017-04-22 NOTE — Progress Notes (Signed)
SLP Cancellation Note  Patient Details Name: Kristen Oneill MRN: [id number] DOB: 09-06-1934   Cancelled treatment:       Reason Eval/Treat Not Completed: Fatigue/lethargy limiting ability to participate (chart reviewed; NSG consulted re: status today). NSG reported adequate toleration of liquid diet this morning w/ no overt s/s of aspiration noted; is able to swallow pills appropriately per NSG. Noted GI note indicating colonic ileus. Due to pt sleeping heavily at this time, ST services will f/u later today or in the morning during a meal. Recommend continued aspiration precautions w/ any oral intake.    Orinda Kenner, MS, CCC-SLP Watson,Katherine 04/22/2017, 1:15 PM

## 2017-04-22 NOTE — Plan of Care (Signed)
Problem: Activity: Goal: Risk for activity intolerance will decrease Outcome: Not Progressing Patient is bed bound.   

## 2017-04-22 NOTE — Progress Notes (Signed)
Kristen Bellows MD, MRCP(U.K) [address]  [place]  [place], [place] [address]  Main: [phone]    Kristen Oneill is being followed for colonic ileus   Subjective: No complaints    Objective: Vital signs in last 24 hours: Vitals:   04/21/17 1453 04/21/17 1910 04/21/17 2212 04/22/17 0612  BP: 133/60 (!) 156/77 (!) 143/85 137/70  Pulse: 85 100 (!) 107 (!) 115  Resp: 16 16 20  (!) 24  Temp: 98 F (36.7 C) 98.3 F (36.8 C) 98.3 F (36.8 C) 99.1 F (37.3 C)  TempSrc: Oral Oral Oral Oral  SpO2: 98% 100% 100% 96%  Weight:      Height:       Weight change:   Intake/Output Summary (Last 24 hours) at 04/22/17 0719 Last data filed at 04/22/17 4944  Gross per 24 hour  Intake           336.67 ml  Output              900 ml  Net          -563.33 ml     Exam: Heart:: Regular rate and rhythm, S1S2 present or without murmur or extra heart sounds Lungs: normal, clear to auscultation and clear to auscultation and percussion Abdomen: less distended than yesterday , soft , non tender, no guarding or rigidity, BS+   Lab Results:  Micro Results: Recent Results (from the past 240 hour(s))  MRSA PCR Screening     Status: None   Collection Time: 04/19/17  3:11 PM  Result Value Ref Range Status   MRSA by PCR NEGATIVE NEGATIVE Final    Comment:        The GeneXpert MRSA Assay (FDA approved for NASAL specimens only), is one component of a comprehensive MRSA colonization surveillance program. It is not intended to diagnose MRSA infection nor to guide or monitor treatment for MRSA infections.    Studies/Results: Dg Abd 2 Views  Result Date: 04/21/2017 CLINICAL DATA:  Abdominal distention. EXAM: ABDOMEN - 2 VIEW COMPARISON:  04/20/2017; 04/15/2017; CT abdomen pelvis - 04/16/2017 FINDINGS: Re- demonstrated marked gas distention of multiple loops of large and small bowel. No definite evidence of volvulus. No definite pneumoperitoneum, pneumatosis or portal venous gas.  Several phleboliths overlie the lower pelvis bilaterally. Otherwise, no definitive abnormal intra- abdominal calcifications. No definite acute osseous abnormalities. IMPRESSION: Re- demonstrated findings most suggestive of severe adynamic ileus. Electronically Signed   By: Sandi Mariscal M.D.   On: 04/21/2017 08:29   Dg Abd Acute W/chest  Result Date: 04/20/2017 CLINICAL DATA:  Ogilvie's syndrome EXAM: DG ABDOMEN ACUTE W/ 1V CHEST COMPARISON:  04/19/2017 FINDINGS: Heart is normal size.  Lungs are clear.  No effusions. Marked gaseous distention of the colon again noted, not significantly changed. Prior CABG. No organomegaly or free air. IMPRESSION: Continued marked gaseous distention of the colon, unchanged. No free air. No acute cardiopulmonary disease. Electronically Signed   By: Rolm Baptise M.D.   On: 04/20/2017 07:48   Medications: I have reviewed the patient's current medications. Scheduled Meds: . aspirin  325 mg Oral Daily  . atorvastatin  20 mg Oral QPM  . bisacodyl  10 mg Rectal Daily  . carbidopa-levodopa  1 tablet Oral TID  . doxepin  10 mg Oral QHS  . heparin  5,000 Units Subcutaneous Q8H  . Influenza vac split quadrivalent PF  0.5 mL Intramuscular Tomorrow-1000  . loratadine  10 mg Oral Daily  . OLANZapine  10  mg Oral QHS  . potassium chloride  20 mEq Oral BID  . primidone  50 mg Oral TID   Continuous Infusions: . sodium chloride Stopped (04/21/17 0815)  . dextrose 5 % and 0.9% NaCl 1,000 mL (04/21/17 1350)   PRN Meds:.docusate sodium, lactulose, ondansetron (ZOFRAN) IV, phenol   Assessment: Principal Problem:   Ileus (HCC) Active Problems:   Pressure injury of skin  Kristen Hughes82 y.o.femalewith abdominal distension, Imaging shows stool in the right colon ,s/p colonic decompression on 04/21/17 , evidence of ischemia seen in the distal transverse colon , sigmoid non obstructing mass seen , tight turn at the recto sigmoid junction. Per radiology this area appears inflamed  on CT scan although not reported so .   Plan  1. Serial abdominal exam. Abdomen appears softer today  2. Continue flushing rectal tube and allow to drain. She has been passing gas per nursing  3. IF abdominal discomfort or distension worsens get imaging 4. F/u biopsies taken yesterday of the mass 5. Surgery following  6. Replace potassium     LOS: 3 days   Kristen Oneill 04/22/2017, 7:19 AM

## 2017-04-22 NOTE — Progress Notes (Addendum)
Patient ID: Kristen Oneill, female   DOB: 19-Sep-1934, 82 y.o.   MRN: [id number]   [facility] PROGRESS NOTE  Kristen Oneill HDQ:[id number] DOB: August 22, 1934 DOA: 04/19/2017 PCP: Patient, No Pcp Per  HPI/Subjective: Patient is not the best historian. I woke her up prior to her eating lunch. She did not complain of any abdominal pain but was pretty distended. The nurse earlier stated the patient did have some stool around the tube.  Objective: Vitals:   04/22/17 0612 04/22/17 1146  BP: 137/70 (!) 164/73  Pulse: (!) 115 (!) 102  Resp: (!) 24 (!) 21  Temp: 99.1 F (37.3 C) 98.2 F (36.8 C)  SpO2: 96% 95%    Filed Weights   04/19/17 0933 04/21/17 1123  Weight: 75.8 kg (167 lb) 75.8 kg (167 lb)    ROS: Review of Systems  Constitutional: Negative for chills and fever.  Eyes: Negative for blurred vision.  Respiratory: Negative for cough and shortness of breath.   Cardiovascular: Negative for chest pain.  Gastrointestinal: Positive for constipation. Negative for abdominal pain, diarrhea, nausea and vomiting.  Genitourinary: Negative for dysuria.  Musculoskeletal: Negative for joint pain.  Neurological: Negative for dizziness and headaches.   Exam: Physical Exam  HENT:  Nose: No mucosal edema.  Mouth/Throat: Uvula swelling present. No oropharyngeal exudate or posterior oropharyngeal edema.  Eyes: Pupils are equal, round, and reactive to light. Conjunctivae, EOM and lids are normal.  Neck: No JVD present. Carotid bruit is not present. No edema present. No thyroid mass and no thyromegaly present.  Cardiovascular: S1 normal and S2 normal.  Exam reveals no gallop.   No murmur heard. Pulses:      Dorsalis pedis pulses are 2+ on the right side, and 2+ on the left side.  Respiratory: No respiratory distress. She has decreased breath sounds in the right lower field and the left lower field. She has no wheezes. She has no rhonchi. She has no rales.  GI: Soft. Bowel sounds are normal. There  is no tenderness.  Musculoskeletal:       Right ankle: She exhibits swelling.       Left ankle: She exhibits swelling.  Lymphadenopathy:    She has no cervical adenopathy.  Neurological: She is alert. No cranial nerve deficit.  Skin: Skin is warm. Nails show no clubbing.  Stage I decubitus ulcer on buttock  Psychiatric: She has a normal mood and affect.      Data Reviewed: Basic Metabolic Panel:  Recent Labs Lab 04/15/17 2344 04/19/17 0948 04/20/17 0315 04/22/17 0452  NA 134* 135 137 138  K 3.9 3.2* 4.0 3.0*  CL 94* 95* 98* 102  CO2 32 31 30 29   GLUCOSE 89 91 104* 123*  BUN 20 13 13 8   CREATININE 0.58 0.39* 0.39* 0.35*  CALCIUM 9.2 9.3 9.1 8.1*  MG  --  1.8  --   --    Liver Function Tests:  Recent Labs Lab 04/15/17 2344 04/19/17 0948  AST 15 16  ALT 6* 8*  ALKPHOS 71 66  BILITOT 0.4 0.3  PROT 7.8 7.1  ALBUMIN 3.8 3.4*    Recent Labs Lab 04/15/17 2344 04/19/17 0948  LIPASE 23 16   CBC:  Recent Labs Lab 04/15/17 2344 04/19/17 0948 04/20/17 0315 04/22/17 0452  WBC 7.1 9.8 10.7 5.8  NEUTROABS 4.6 7.7*  --   --   HGB 13.9 13.2 14.2 12.3  HCT 41.2 38.1 40.8 34.9*  MCV 93.7 90.8 93.4 92.3  PLT 215 209  204 239   Cardiac Enzymes:  Recent Labs Lab 04/19/17 0948  TROPONINI <0.03   BNP (last 3 results)  Recent Labs  04/19/17 0948  BNP 22.0     CBG:  Recent Labs Lab 04/21/17 1728 04/21/17 2210 04/22/17 0025 04/22/17 0614 04/22/17 1143  GLUCAP 88 165* 149* 114* 108*    Recent Results (from the past 240 hour(s))  MRSA PCR Screening     Status: None   Collection Time: 04/19/17  3:11 PM  Result Value Ref Range Status   MRSA by PCR NEGATIVE NEGATIVE Final    Comment:        The GeneXpert MRSA Assay (FDA approved for NASAL specimens only), is one component of a comprehensive MRSA colonization surveillance program. It is not intended to diagnose MRSA infection nor to guide or monitor treatment for MRSA infections.       Studies: Dg Abd 2 Views  Result Date: 04/21/2017 CLINICAL DATA:  Abdominal distention. EXAM: ABDOMEN - 2 VIEW COMPARISON:  04/20/2017; 04/15/2017; CT abdomen pelvis - 04/16/2017 FINDINGS: Re- demonstrated marked gas distention of multiple loops of large and small bowel. No definite evidence of volvulus. No definite pneumoperitoneum, pneumatosis or portal venous gas. Several phleboliths overlie the lower pelvis bilaterally. Otherwise, no definitive abnormal intra- abdominal calcifications. No definite acute osseous abnormalities. IMPRESSION: Re- demonstrated findings most suggestive of severe adynamic ileus. Electronically Signed   By: Sandi Mariscal M.D.   On: 04/21/2017 08:29    Scheduled Meds: . aspirin  325 mg Oral Daily  . atorvastatin  20 mg Oral QPM  . carbidopa-levodopa  1 tablet Oral TID  . doxepin  10 mg Oral QHS  . heparin  5,000 Units Subcutaneous Q8H  . Influenza vac split quadrivalent PF  0.5 mL Intramuscular Tomorrow-1000  . lactulose  30 g Per Tube TID  . loratadine  10 mg Oral Daily  . OLANZapine  10 mg Oral QHS  . potassium chloride  20 mEq Oral BID  . primidone  50 mg Oral TID   Continuous Infusions: . sodium chloride Stopped (04/21/17 0815)  . dextrose 5 % and 0.9% NaCl 1,000 mL (04/21/17 1350)    Assessment/Plan:  1. 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  Rectal tube placed for decompression. Abdomen this afternoon was still very distended. Lactulose to try to have bowel movements. Patient on clear liquid diet. Case discussed with general surgery and they would rather not do aggressive management on her at this point.  2. Parkinson's disease on Sinemet 3. Bipolar disorder on olanzapine 4. Hyperlipidemia unspecified on atorvastatin 5. Stage I decubitus ulcer on buttock. Change positions frequently 6. Appreciate swallow evaluation 7. Uvula swollen. Continue Claritin. 8. Hypokalemia. Replace potassium orally. Also replace magnesium  orally  Code Status:     Code Status Orders        Start     Ordered   04/19/17 1419  Full code  Continuous     04/19/17 1418    Code Status History    Date Active Date Inactive Code Status Order ID Comments User Context   This patient has a current code status but no historical code status.     Family Communication: Spoke with son on the phone yesterday Disposition Plan: to be determined based on clinical course on whether or not she has further bowel movements.  Consultants:  General surgery  Gastroenterology  Time spent: 24 minutes  [place], [place]

## 2017-04-22 NOTE — Progress Notes (Signed)
Pt complained of abdominal and generalized pain. Primary nurse notified prime Dr. Dr. Jannifer Franklin to place orders. Primary nurse to continue to monitor.

## 2017-04-23 ENCOUNTER — Inpatient Hospital Stay: Payer: Medicare Other

## 2017-04-23 DIAGNOSIS — R140 Abdominal distension (gaseous): Secondary | ICD-10-CM

## 2017-04-23 LAB — BASIC METABOLIC PANEL
ANION GAP: 7 (ref 5–15)
BUN: <5 mg/dL — ABNORMAL LOW (ref 6–20)
CALCIUM: 8.8 mg/dL — AB (ref 8.9–10.3)
CHLORIDE: 103 mmol/L (ref 101–111)
CO2: 30 mmol/L (ref 22–32)
Creatinine, Ser: 0.33 mg/dL — ABNORMAL LOW (ref 0.44–1.00)
GFR calc Af Amer: >60 mL/min (ref >60)
GFR calc non Af Amer: >60 mL/min (ref >60)
GLUCOSE: 116 mg/dL — AB (ref 65–99)
Potassium: 3.3 mmol/L — ABNORMAL LOW (ref 3.5–5.1)
Sodium: 140 mmol/L (ref 135–145)

## 2017-04-23 LAB — MAGNESIUM: Magnesium: 1.7 mg/dL (ref 1.7–2.4)

## 2017-04-23 MED ORDER — PIPERACILLIN-TAZOBACTAM 3.375 G IVPB
3.3750 g | Freq: Three times a day (TID) | INTRAVENOUS | Status: DC
  Administered 2017-04-23 – 2017-04-24 (×4): 3.375 g via INTRAVENOUS
  Filled 2017-04-23 (×4): qty 50

## 2017-04-23 MED ORDER — IOPAMIDOL (ISOVUE-370) INJECTION 76%
100.0000 mL | Freq: Once | INTRAVENOUS | Status: AC | PRN
  Administered 2017-04-23: 100 mL via INTRAVENOUS

## 2017-04-23 NOTE — Progress Notes (Addendum)
Jonathon Bellows MD, MRCP(U.K) [address]  [place]  [place], [place] [address]  Main: [place] is being followed for abdominal distension    Subjective: Increased abdominal distension , minimal stool in rectal tube    Objective: Vital signs in last 24 hours: Vitals:   04/22/17 1146 04/22/17 1942 04/23/17 0511 04/23/17 0842  BP: (!) 164/73 (!) 141/75 (!) 148/85 (!) 151/93  Pulse: (!) 102 83 98 97  Resp: (!) 21 19 17 18   Temp: 98.2 F (36.8 C) 98 F (36.7 C) 98.6 F (37 C) 99.2 F (37.3 C)  TempSrc: Axillary Oral Oral Oral  SpO2: 95% 97% 95% 96%  Weight:      Height:       Weight change:   Intake/Output Summary (Last 24 hours) at 04/23/17 1005 Last data filed at 04/23/17 0547  Gross per 24 hour  Intake          1496.66 ml  Output             1401 ml  Net            95.66 ml     Exam: Heart:: Regular rate and rhythm, S1S2 present or without murmur or extra heart sounds Lungs: normal, clear to auscultation and clear to auscultation and percussion Abdomen: markedly distended abdomen, no tenderness, tense, bs absent , no guarding or rigidity    Lab Results: @LABTEST2 @ Micro Results: Recent Results (from the past 240 hour(s))  MRSA PCR Screening     Status: None   Collection Time: 04/19/17  3:11 PM  Result Value Ref Range Status   MRSA by PCR NEGATIVE NEGATIVE Final    Comment:        The GeneXpert MRSA Assay (FDA approved for NASAL specimens only), is one component of a comprehensive MRSA colonization surveillance program. It is not intended to diagnose MRSA infection nor to guide or monitor treatment for MRSA infections.    Studies/Results: No results found. Medications: I have reviewed the patient's current medications. Scheduled Meds: . aspirin  325 mg Oral Daily  . atorvastatin  20 mg Oral QPM  . carbidopa-levodopa  1 tablet Oral TID  . doxepin  10 mg Oral QHS  . heparin  5,000 Units Subcutaneous Q8H  . Influenza vac  split quadrivalent PF  0.5 mL Intramuscular Tomorrow-1000  . lactulose  30 g Oral TID  . loratadine  10 mg Oral Daily  . magnesium oxide  400 mg Oral BID  . OLANZapine  10 mg Oral QHS  . potassium chloride  20 mEq Oral BID  . primidone  50 mg Oral TID   Continuous Infusions: . sodium chloride Stopped (04/21/17 0815)  . dextrose 5 % and 0.9% NaCl 40 mL/hr at 04/22/17 1547   PRN Meds:.acetaminophen, docusate sodium, ondansetron (ZOFRAN) IV, oxyCODONE, phenol   Assessment: Principal Problem:   Ileus (HCC) Active Problems:   Pressure injury of skin   Ogilvie's syndrome  Abreanna Hughes82 y.o.femalewith abdominal distension, Imaging shows stool in the right colon ,s/p colonic decompression on 04/21/17 , evidence of ischemia seen in the distal transverse colon , sigmoid non obstructing mass seen , tight turn at the recto sigmoid junction. Per radiology this area appears inflamed on CT scan although not reported so . Overnight significant distension of the abdomen , minimal output from rectal tube    I helped remove the rectal tube with subsequent large expulsion of gas and thick brown stool .  Distension improved. I discussed with Dr Dahlia Byes about long term plans. Medical management limited at this time due to recurrence - our options are golytely + enemas , mobilize the patient , correct electrolytes. I suggested to DR Leslye Peer we get a CT scan of the abdomen to r/o ischemic of the GUT . May need a palliative surgical decompression or a percutaneous decompression as a bridge . Biopsies taken from the polyp are not cancerous but does not exclude cancer in the polyp - not amenable to resection at this point while she is so ill.     LOS: 4 days   Jonathon Bellows 04/23/2017, 10:05 AM

## 2017-04-23 NOTE — Progress Notes (Signed)
Pharmacy Antibiotic Note  Kristen Oneill is a 82 y.o. female admitted on 04/19/2017 with ileus.  Pharmacy has been consulted for Zosyn  dosing. Patient has documented allergy to penicillin however no reaction was stated. Contacted MD Leslye Peer, MD discussed the reaction with the patient and son. Reaction to PCNs is dizziness. MD ok with continuing  Plan: Will start Zosyn 3.375 g IV q8 hours.    Height: 5\' 6"  (167.6 cm) Weight: 167 lb (75.8 kg) IBW/kg (Calculated) : 59.3  Temp (24hrs), Avg:98.5 F (36.9 C), Min:98 F (36.7 C), Max:99.2 F (37.3 C)   Recent Labs Lab 04/19/17 0948 04/20/17 0315 04/22/17 0452 04/23/17 0605  WBC 9.8 10.7 5.8  --   CREATININE 0.39* 0.39* 0.35* 0.33*    Estimated Creatinine Clearance: 58.3 mL/min (A) (by C-G formula based on SCr of 0.33 mg/dL (L)).    Allergies  Allergen Reactions  . Penicillins     Antimicrobials this admission: Zosyn 9/20  >>    >>   Dose adjustments this admission:   Microbiology results:  BCx:   UCx:    Sputum:    MRSA PCR:   Thank you for allowing pharmacy to be a part of this patient's care.  Allure Greaser D 04/23/2017 11:29 AM

## 2017-04-23 NOTE — Progress Notes (Signed)
Patient ID: Kristen Oneill, female   DOB: 1935/01/07, 82 y.o.   MRN: [id number]   [facility] PROGRESS NOTE  Kristen Oneill AYT:[id number] DOB: 09-Jan-1935 DOA: 04/19/2017 PCP: Patient, No Pcp Per  HPI/Subjective: Patient seen this morning and had abdominal distention and pain. Rectal tube was removed and she did have a large bowel movement. No nausea or vomiting.  Objective: Vitals:   04/23/17 0842 04/23/17 1146  BP: (!) 151/93 (!) 142/71  Pulse: 97 (!) 101  Resp: 18   Temp: 99.2 F (37.3 C) 99 F (37.2 C)  SpO2: 96% 95%    Filed Weights   04/19/17 0933 04/21/17 1123  Weight: 75.8 kg (167 lb) 75.8 kg (167 lb)    ROS: Review of Systems  Constitutional: Negative for chills and fever.  Eyes: Negative for blurred vision.  Respiratory: Negative for cough and shortness of breath.   Cardiovascular: Negative for chest pain.  Gastrointestinal: Positive for abdominal pain and constipation. Negative for diarrhea, nausea and vomiting.  Genitourinary: Negative for dysuria.  Musculoskeletal: Negative for joint pain.  Neurological: Negative for dizziness and headaches.   Exam: Physical Exam  HENT:  Nose: No mucosal edema.  Mouth/Throat: Uvula swelling present. No oropharyngeal exudate or posterior oropharyngeal edema.  Eyes: Pupils are equal, round, and reactive to light. Conjunctivae, EOM and lids are normal.  Neck: No JVD present. Carotid bruit is not present. No edema present. No thyroid mass and no thyromegaly present.  Cardiovascular: S1 normal and S2 normal.  Exam reveals no gallop.   No murmur heard. Pulses:      Dorsalis pedis pulses are 2+ on the right side, and 2+ on the left side.  Respiratory: No respiratory distress. She has decreased breath sounds in the right lower field and the left lower field. She has no wheezes. She has no rhonchi. She has no rales.  GI: Soft. Bowel sounds are normal. She exhibits distension. There is tenderness.  Musculoskeletal:       Right  ankle: She exhibits swelling.       Left ankle: She exhibits swelling.  Lymphadenopathy:    She has no cervical adenopathy.  Neurological: She is alert. No cranial nerve deficit.  Skin: Skin is warm. Nails show no clubbing.  Stage I decubitus ulcer on buttock  Psychiatric: She has a normal mood and affect.      Data Reviewed: Basic Metabolic Panel:  Recent Labs Lab 04/19/17 0948 04/20/17 0315 04/22/17 0452 04/23/17 0605  NA 135 137 138 140  K 3.2* 4.0 3.0* 3.3*  CL 95* 98* 102 103  CO2 31 30 29 30   GLUCOSE 91 104* 123* 116*  BUN 13 13 8  <5*  CREATININE 0.39* 0.39* 0.35* 0.33*  CALCIUM 9.3 9.1 8.1* 8.8*  MG 1.8  --   --  1.7   Liver Function Tests:  Recent Labs Lab 04/19/17 0948  AST 16  ALT 8*  ALKPHOS 66  BILITOT 0.3  PROT 7.1  ALBUMIN 3.4*    Recent Labs Lab 04/19/17 0948  LIPASE 16   CBC:  Recent Labs Lab 04/19/17 0948 04/20/17 0315 04/22/17 0452  WBC 9.8 10.7 5.8  NEUTROABS 7.7*  --   --   HGB 13.2 14.2 12.3  HCT 38.1 40.8 34.9*  MCV 90.8 93.4 92.3  PLT 209 204 239   Cardiac Enzymes:  Recent Labs Lab 04/19/17 0948  TROPONINI <0.03   BNP (last 3 results)  Recent Labs  04/19/17 0948  BNP 22.0  CBG:  Recent Labs Lab 04/21/17 2210 04/22/17 0025 04/22/17 0614 04/22/17 1143 04/22/17 1757  GLUCAP 165* 149* 114* 108* 117*    Recent Results (from the past 240 hour(s))  MRSA PCR Screening     Status: None   Collection Time: 04/19/17  3:11 PM  Result Value Ref Range Status   MRSA by PCR NEGATIVE NEGATIVE Final    Comment:        The GeneXpert MRSA Assay (FDA approved for NASAL specimens only), is one component of a comprehensive MRSA colonization surveillance program. It is not intended to diagnose MRSA infection nor to guide or monitor treatment for MRSA infections.      Studies: Ct Angio Abd/pel W/ And/or W/o  Result Date: 04/23/2017 CLINICAL DATA:  82 year old with abdominal distention and pain.  Gastroenteritis or colitis suspected. EXAM: CT ANGIOGRAPHY ABDOMEN AND PELVIS WITH CONTRAST CONTRAST TECHNIQUE: Multidetector CT imaging of the abdomen and pelvis was performed using the standard protocol during bolus administration of intravenous contrast. Multiplanar reconstructed images and MIPs were obtained and reviewed to evaluate the vascular anatomy. CONTRAST:  100 mL Isovue 370 COMPARISON:  04/16/2017 FINDINGS: VASCULAR Aorta: Atherosclerotic calcifications in the abdominal aorta without dissection or aneurysm. Celiac: Patent without evidence of aneurysm, dissection, vasculitis or significant stenosis. SMA: Patent without evidence of aneurysm, dissection, vasculitis or significant stenosis. Renals: Both renal arteries are patent without evidence of aneurysm, dissection, vasculitis, fibromuscular dysplasia or significant stenosis. IMA: Patent without evidence of aneurysm, dissection, vasculitis or significant stenosis. Inflow: Patent without evidence of aneurysm, dissection, vasculitis or significant stenosis. Proximal Outflow: Proximal femoral arteries are patent bilaterally. Veins: Portal venous system is patent.  IVC is patent. Review of the MIP images confirms the above findings. NON-VASCULAR Lower chest: Trace bilateral pleural fluid. Patchy parenchymal densities at the right lung base concerning for infection or inflammation. Right lung disease is new. Hepatobiliary: Gallbladder has been removed. Minimal intrahepatic biliary dilatation. No suspicious liver lesions. Pancreas: Normal appearance of the pancreas without inflammation or duct dilatation. Spleen: Normal appearance of spleen without enlargement. Adrenals/Urinary Tract: Adrenal glands are within normal limits. Evidence for left renal cysts. One cystic structure along the posterior left kidney is too small to definitively characterize. No hydronephrosis. Moderate distention of the urinary bladder. Stomach/Bowel: Increased small bowel dilatation  compared to the recent comparison examination. Small bowel loops now measure up to 4.9 cm. Again noted is marked distension of the rectum and sigmoid colon. No evidence to suggest a volvulus. Again noted is a large amount of stool-like material in the rectum and sigmoid colon. Stool material has a fluid component based on the air-fluid level. Again noted is mild wall thickening in the rectum and sigmoid colon with mild pericolonic stranding. There is a decompressed loop of small bowel in the central abdomen on series 4, image 33. Lymphatic: No significant lymph node enlargement in the abdomen or pelvis. Reproductive: Status post hysterectomy. No adnexal masses. Other: No free fluid.  No free air. Musculoskeletal: No acute abnormality. IMPRESSION: VASCULAR Mild atherosclerotic disease in the abdomen and pelvis. No stenosis involving the mesenteric arteries. No evidence for mesenteric ischemia. NON-VASCULAR Again noted is marked distension of the distal colon with mild wall thickening and mild pericolonic edema in the rectal region. Large amount of liquid stool in the rectum and sigmoid colon. Findings could be associated with chronic constipation. Difficult to exclude mild colitis. Increased small bowel distension compared to the recent comparison examination. There is concern for a possible transition point in the  mid abdomen and findings could be associated with an ileus or partial small bowel obstruction. New parenchymal densities at the right lung base with trace pleural effusions. Findings are suggestive for right lower lung pneumonia. This is new since 04/16/2017. These results will be called to the ordering clinician or representative by the Radiologist Assistant, and communication documented in the PACS or zVision Dashboard. Electronically Signed   By: Markus Daft M.D.   On: 04/23/2017 10:33    Scheduled Meds: . aspirin  325 mg Oral Daily  . atorvastatin  20 mg Oral QPM  . carbidopa-levodopa  1 tablet  Oral TID  . doxepin  10 mg Oral QHS  . heparin  5,000 Units Subcutaneous Q8H  . lactulose  30 g Oral TID  . loratadine  10 mg Oral Daily  . magnesium oxide  400 mg Oral BID  . OLANZapine  10 mg Oral QHS  . potassium chloride  20 mEq Oral BID  . primidone  50 mg Oral TID   Continuous Infusions: . sodium chloride Stopped (04/21/17 0815)  . dextrose 5 % and 0.9% NaCl 40 mL/hr at 04/22/17 1547  . piperacillin-tazobactam (ZOSYN)  IV 3.375 g (04/23/17 1213)    Assessment/Plan:  1. 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   Rectal tube removed and the patient did have a bowel movement. Continue lactulose. CT angiogram of the abdomen was negative for ischemic colitis. They did comment on ileus versus partial small bowel obstruction. Patient on full liquid diet for now.  Case discussed with surgery and the patient is not a good surgical candidate. 2. Aspiration pneumonia start Zosyn 3. Parkinson's disease on Sinemet 4. Bipolar disorder on olanzapine 5. Hyperlipidemia unspecified on atorvastatin 6. Stage I decubitus ulcer on buttock. Change positions frequently 7. Appreciate swallow evaluation 8. Uvula swollen. Continue Claritin. 9. Hypokalemia. Replace potassium orally. Also replace magnesium orally  Code Status:     Code Status Orders        Start     Ordered   04/19/17 1419  Full code  Continuous     04/19/17 1418    Code Status History    Date Active Date Inactive Code Status Order ID Comments User Context   This patient has a current code status but no historical code status.     Family Communication: Spoke with son on the phone Today Disposition Plan: Unclear at this point  Consultants:  General surgery  Gastroenterology  Time spent: 24 minutes  Loletha Grayer  [employer]

## 2017-04-23 NOTE — Progress Notes (Signed)
Speech Therapy Note: reviewed chart notes; consulted NSG re: pt's status. Pt exhibited overt coughing when attempting to swallow pills (w/ water) this morning w/ NSG. No other reports of difficulty have been noted by NSG. Pt has experienced extended illness of GI issues and is weakened by such.  Discussed using aspiration precautions (including no straws) w/ more consistent monitoring, and using a puree to give Pills in - [place] Pills as able. NSG agreed. ST services will f/u in the morning w/ toleration of oral intake and make adjustments to diet consistency as needed.    Orinda Kenner, [place], CCC-SLP

## 2017-04-24 DIAGNOSIS — E43 Unspecified severe protein-calorie malnutrition: Secondary | ICD-10-CM | POA: Insufficient documentation

## 2017-04-24 LAB — BASIC METABOLIC PANEL
ANION GAP: 8 (ref 5–15)
BUN: <5 mg/dL — ABNORMAL LOW (ref 6–20)
CALCIUM: 8.8 mg/dL — AB (ref 8.9–10.3)
CO2: 30 mmol/L (ref 22–32)
Chloride: 100 mmol/L — ABNORMAL LOW (ref 101–111)
Creatinine, Ser: 0.43 mg/dL — ABNORMAL LOW (ref 0.44–1.00)
Glucose, Bld: 109 mg/dL — ABNORMAL HIGH (ref 65–99)
POTASSIUM: 3.4 mmol/L — AB (ref 3.5–5.1)
SODIUM: 138 mmol/L (ref 135–145)

## 2017-04-24 LAB — CBC
HCT: 36.7 % (ref 35.0–47.0)
Hemoglobin: 12.6 g/dL (ref 12.0–16.0)
MCH: 32.1 pg (ref 26.0–34.0)
MCHC: 34.5 g/dL (ref 32.0–36.0)
MCV: 93.2 fL (ref 80.0–100.0)
Platelets: 235 10*3/uL (ref 150–440)
RBC: 3.93 MIL/uL (ref 3.80–5.20)
RDW: 13.7 % (ref 11.5–14.5)
WBC: 6 10*3/uL (ref 3.6–11.0)

## 2017-04-24 LAB — PHOSPHORUS: PHOSPHORUS: 2.5 mg/dL (ref 2.5–4.6)

## 2017-04-24 MED ORDER — ENSURE ENLIVE PO LIQD
237.0000 mL | Freq: Two times a day (BID) | ORAL | Status: DC
  Administered 2017-04-24 – 2017-04-26 (×3): 237 mL via ORAL

## 2017-04-24 MED ORDER — AMPICILLIN-SULBACTAM SODIUM 3 (2-1) G IJ SOLR
3.0000 g | Freq: Four times a day (QID) | INTRAMUSCULAR | Status: DC
  Administered 2017-04-24 – 2017-04-28 (×15): 3 g via INTRAVENOUS
  Filled 2017-04-24 (×19): qty 3

## 2017-04-24 MED ORDER — ADULT MULTIVITAMIN W/MINERALS CH
1.0000 | ORAL_TABLET | Freq: Every day | ORAL | Status: DC
  Administered 2017-04-24 – 2017-04-26 (×2): 1 via ORAL
  Filled 2017-04-24 (×3): qty 1

## 2017-04-24 NOTE — Progress Notes (Signed)
Pharmacy Antibiotic Note  Kristen Oneill is a 82 y.o. female admitted on 04/19/2017 with ileus.  Pharmacy has been consulted for Zosyn dosing for aspiration PNA.  Patient has documented allergy to penicillin however no reaction was stated. . Reaction to PCNs was directly reported to MD as dizziness. MD ok with continuing with Zosyn.   Plan: Continue Zosyn 3.375 g IV q8 hours.    Height: 5\' 6"  (167.6 cm) Weight: 168 lb 14.4 oz (76.6 kg) (bed weight) IBW/kg (Calculated) : 59.3  Temp (24hrs), Avg:98 F (36.7 C), Min:97.6 F (36.4 C), Max:98.4 F (36.9 C)   Recent Labs Lab 04/19/17 0948 04/20/17 0315 04/22/17 0452 04/23/17 0605 04/24/17 0605  WBC 9.8 10.7 5.8  --  6.0  CREATININE 0.39* 0.39* 0.35* 0.33* 0.43*    Estimated Creatinine Clearance: 58.6 mL/min (A) (by C-G formula based on SCr of 0.43 mg/dL (L)).    Allergies  Allergen Reactions  . Penicillins     Antimicrobials this admission: Zosyn 9/20  >>   Dose adjustments this admission:   Microbiology results:  MRSA PCR: (-)   Thank you for allowing pharmacy to be a part of this patient's care.  Pernell Dupre, PharmD, BCPS Clinical Pharmacist 04/24/2017 12:17 PM

## 2017-04-24 NOTE — Care Management Important Message (Signed)
Important Message  Patient Details  Name: Genavive Kubicki MRN: [id number] Date of Birth: [birth year]   Medicare Important Message Given:  Yes    Beverly Sessions, RN 04/24/2017, 2:31 PM

## 2017-04-24 NOTE — Progress Notes (Signed)
Speech Language Pathology Treatment: Dysphagia  Patient Details Name: Kristen Oneill MRN: [id number] DOB: 21-Jun-1935 Today's Date: 04/24/2017 Time: 1020-1105 SLP Time Calculation (min) (ACUTE ONLY): 45 min  Assessment / Plan / Recommendation Clinical Impression  Pt appeared to have difficulty swallowing Pills w/ NSG yesterday; using water to drink and swallow Pills(?). Pt seen today for toleration of oral intake including thin liquids. Pt continues to present w/ min declined Cognitive status but is able to follow through w/ all instruction given min verbal cues. Pt also continues to have significant GI issues re: Ileus, distention, and discomfort in general. Reduced oral intake is reported by [facility] staff. At this session, pt had to be strongly encouraged to take bites/sips(few) offered.  Pt appears at reduced risk for aspiration w/ oral intake when following general aspiration precautions including NO Straw use and drinking slowly sitting fully upright; audible swallows were noted but no other overt s/s of aspiration were assessed for. Vocal quality remained clear b/t trials when assessed and no decline in respiratory status during/post trials. Oral phase appeared wfl for bolus trials accepted (liquids and purees). Pt remains on a full liquid diet d/t GI issues per MD. Of note, pt does exhibit min phlegm that she clears and expectorates intermittently w/ encouragement; unsure if related to GI issues(?). Pt does not appear to present w/ overt oropharyngeal phase dysphagia as per this session though only a few trials were given d/t restricted po status per GI, and pt's lessened desire for po's overall. Pt does exhibit audible swallows when drinking thin liquids via cup - there did not appear to be any distress w/ swallowing. Encouraged smaller, single sips, slowly to reduce risk for aspiration.  Due to pt's overall declined medical status/weakness and baseline neurological deficits (Parkinson's Disease),  would recommend full supervision/monitoring w/ any po trials and aspiration precautions. Recommend ALL PILLS IN PUREE for safer swallowing. ST services will f/u w/ pt's status for toleration of diet and w/ w/ trials as diet consistency can upgrade to solids again per MD/GI. NSG and MD updated on today's session.      HPI: Pt is a 82 y.o. 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 Evacuated and now in assisted living facility since last week due to [facility]. Came to ER 3 days ago for abdominal distention and pain; found to have colonic distention and stool, sent home. Came today again with worsening distention and pain. XRAY confirmed Ileus. Pt is on a full liquid diet per MD/GI but not taking much po's. There seemed to be a concern re: swallowing Pills yesterday per NSG report.       SLP Plan  Continue with current plan of care       Recommendations  Diet recommendations: Dysphagia 1 (puree);Thin liquid (on full liquid currently per MD/GI) Liquids provided via: Cup;No straw Medication Administration: Whole meds with puree (but crushed in puree if needed and able to crush) Supervision: Patient able to self feed;Staff to assist with self feeding;Full supervision/cueing for compensatory strategies Compensations: Minimize environmental distractions;Slow rate;Small sips/bites;Follow solids with liquid Postural Changes and/or Swallow Maneuvers: Seated upright 90 degrees;Upright 30-60 min after meal                General recommendations:  (Dietician f/u for nutritional support) Oral Care Recommendations: Oral care BID;Patient independent with oral care;Staff/trained caregiver to provide oral  care Follow up Recommendations: None (TBD) SLP Visit Diagnosis: Dysphagia, pharyngeal phase (R13.13);Dysphagia, pharyngoesophageal phase (R13.14) Plan: Continue with  current plan of care       [place], [place], CCC-SLP Watson,Katherine 04/24/2017, 12:41 PM

## 2017-04-24 NOTE — Clinical Social Work Note (Signed)
The physician has informed CSW that patient will require potential serial rectal exams due to anal sphincter constriction. This is not able to be provided in an assisted living environment and a SNF bed search will need to be conducted. As patient and son are not able to return yet to their home in [place], local placement will need to be pursued. Physician states that discharge is not expected over the weekend and surgery has been consulted to see if patient would be a candidate for botox injections. Patient's son not in room and CSW could not reach by phone to discuss the above. CSW will prepare info for a bedsearch and will go ahead and initiate a pasrr as patient may be a level 2 due to her bipolar. Shela Leff MSW,LCSW [phone]

## 2017-04-24 NOTE — Progress Notes (Signed)
Pharmacy Antibiotic Note  Kristen Oneill is a 82 y.o. female admitted on 04/19/2017 with ileus.  Pharmacy was consulted for Zosyn dosing on 9/20, now switching to Unasyn for aspiration PNA.  Patient has documented allergy to penicillin however no reaction was stated. . Reaction to PCNs was directly reported to MD as dizziness. MD ok with continuing with Abx.   Plan: Start Unasyn 3g IV every 6 hours.    Height: 5\' 6"  (167.6 cm) Weight: 168 lb 14.4 oz (76.6 kg) (bed weight) IBW/kg (Calculated) : 59.3  Temp (24hrs), Avg:98 F (36.7 C), Min:97.6 F (36.4 C), Max:98.4 F (36.9 C)   Recent Labs Lab 04/19/17 0948 04/20/17 0315 04/22/17 0452 04/23/17 0605 04/24/17 0605  WBC 9.8 10.7 5.8  --  6.0  CREATININE 0.39* 0.39* 0.35* 0.33* 0.43*    Estimated Creatinine Clearance: 58.6 mL/min (A) (by C-G formula based on SCr of 0.43 mg/dL (L)).    Allergies  Allergen Reactions  . Penicillins     Antimicrobials this admission: Zosyn 9/20  >> 9/21 Unasyn 9/21 >>   Dose adjustments this admission:   Microbiology results:  MRSA PCR: (-)   Thank you for allowing pharmacy to be a part of this patient's care.  Pernell Dupre, PharmD, BCPS Clinical Pharmacist 04/24/2017 2:37 PM

## 2017-04-24 NOTE — Progress Notes (Signed)
Kristen Bellows MD, MRCP(U.K) [address]  [place]  [place], [place] [address]  Main: [place] is being followed for distended abdomen   Subjective: Increased abdominal distension with pain.    Objective: Vital signs in last 24 hours: Vitals:   04/23/17 1146 04/23/17 2118 04/24/17 0450 04/24/17 0817  BP: (!) 142/71 (!) 144/63 (!) 148/66 (!) 149/69  Pulse: (!) 101 94 97 93  Resp:  18 (!) 24 18  Temp: 99 F (37.2 C) 97.6 F (36.4 C) 98.4 F (36.9 C) 98.1 F (36.7 C)  TempSrc: Oral Oral Oral Oral  SpO2: 95% 98% 97% 96%  Weight:      Height:       Weight change:   Intake/Output Summary (Last 24 hours) at 04/24/17 1039 Last data filed at 04/24/17 0457  Gross per 24 hour  Intake           941.33 ml  Output              250 ml  Net           691.33 ml     Exam: Heart:: Regular rate and rhythm, S1S2 present or without murmur or extra heart sounds Lungs: normal, clear to auscultation and clear to auscultation and percussion Abdomen: significant abdominal distension, tinkling bowel sounds, mild abdominal tenderensss.    Lab Results: @LABTEST2 @ Micro Results: Recent Results (from the past 240 hour(s))  MRSA PCR Screening     Status: None   Collection Time: 04/19/17  3:11 PM  Result Value Ref Range Status   MRSA by PCR NEGATIVE NEGATIVE Final    Comment:        The GeneXpert MRSA Assay (FDA approved for NASAL specimens only), is one component of a comprehensive MRSA colonization surveillance program. It is not intended to diagnose MRSA infection nor to guide or monitor treatment for MRSA infections.    Studies/Results: Ct Angio Abd/pel W/ And/or W/o  Result Date: 04/23/2017 CLINICAL DATA:  82 year old with abdominal distention and pain. Gastroenteritis or colitis suspected. EXAM: CT ANGIOGRAPHY ABDOMEN AND PELVIS WITH CONTRAST CONTRAST TECHNIQUE: Multidetector CT imaging of the abdomen and pelvis was performed using the standard  protocol during bolus administration of intravenous contrast. Multiplanar reconstructed images and MIPs were obtained and reviewed to evaluate the vascular anatomy. CONTRAST:  100 mL Isovue 370 COMPARISON:  04/16/2017 FINDINGS: VASCULAR Aorta: Atherosclerotic calcifications in the abdominal aorta without dissection or aneurysm. Celiac: Patent without evidence of aneurysm, dissection, vasculitis or significant stenosis. SMA: Patent without evidence of aneurysm, dissection, vasculitis or significant stenosis. Renals: Both renal arteries are patent without evidence of aneurysm, dissection, vasculitis, fibromuscular dysplasia or significant stenosis. IMA: Patent without evidence of aneurysm, dissection, vasculitis or significant stenosis. Inflow: Patent without evidence of aneurysm, dissection, vasculitis or significant stenosis. Proximal Outflow: Proximal femoral arteries are patent bilaterally. Veins: Portal venous system is patent.  IVC is patent. Review of the MIP images confirms the above findings. NON-VASCULAR Lower chest: Trace bilateral pleural fluid. Patchy parenchymal densities at the right lung base concerning for infection or inflammation. Right lung disease is new. Hepatobiliary: Gallbladder has been removed. Minimal intrahepatic biliary dilatation. No suspicious liver lesions. Pancreas: Normal appearance of the pancreas without inflammation or duct dilatation. Spleen: Normal appearance of spleen without enlargement. Adrenals/Urinary Tract: Adrenal glands are within normal limits. Evidence for left renal cysts. One cystic structure along the posterior left kidney is too small to definitively characterize. No hydronephrosis. Moderate distention of  the urinary bladder. Stomach/Bowel: Increased small bowel dilatation compared to the recent comparison examination. Small bowel loops now measure up to 4.9 cm. Again noted is marked distension of the rectum and sigmoid colon. No evidence to suggest a volvulus.  Again noted is a large amount of stool-like material in the rectum and sigmoid colon. Stool material has a fluid component based on the air-fluid level. Again noted is mild wall thickening in the rectum and sigmoid colon with mild pericolonic stranding. There is a decompressed loop of small bowel in the central abdomen on series 4, image 33. Lymphatic: No significant lymph node enlargement in the abdomen or pelvis. Reproductive: Status post hysterectomy. No adnexal masses. Other: No free fluid.  No free air. Musculoskeletal: No acute abnormality. IMPRESSION: VASCULAR Mild atherosclerotic disease in the abdomen and pelvis. No stenosis involving the mesenteric arteries. No evidence for mesenteric ischemia. NON-VASCULAR Again noted is marked distension of the distal colon with mild wall thickening and mild pericolonic edema in the rectal region. Large amount of liquid stool in the rectum and sigmoid colon. Findings could be associated with chronic constipation. Difficult to exclude mild colitis. Increased small bowel distension compared to the recent comparison examination. There is concern for a possible transition point in the mid abdomen and findings could be associated with an ileus or partial small bowel obstruction. New parenchymal densities at the right lung base with trace pleural effusions. Findings are suggestive for right lower lung pneumonia. This is new since 04/16/2017. These results will be called to the ordering clinician or representative by the Radiologist Assistant, and communication documented in the PACS or zVision Dashboard. Electronically Signed   By: Markus Daft M.D.   On: 04/23/2017 10:33   Medications: I have reviewed the patient's current medications. Scheduled Meds: . aspirin  325 mg Oral Daily  . atorvastatin  20 mg Oral QPM  . carbidopa-levodopa  1 tablet Oral TID  . doxepin  10 mg Oral QHS  . heparin  5,000 Units Subcutaneous Q8H  . lactulose  30 g Oral TID  . loratadine  10 mg  Oral Daily  . magnesium oxide  400 mg Oral BID  . OLANZapine  10 mg Oral QHS  . potassium chloride  20 mEq Oral BID  . primidone  50 mg Oral TID   Continuous Infusions: . sodium chloride Stopped (04/21/17 0815)  . dextrose 5 % and 0.9% NaCl 40 mL/hr at 04/23/17 1537  . piperacillin-tazobactam (ZOSYN)  IV 3.375 g (04/24/17 0459)   PRN Meds:.acetaminophen, docusate sodium, ondansetron (ZOFRAN) IV, oxyCODONE, phenol   Assessment: Principal Problem:   Ileus (HCC) Active Problems:   Pressure injury of skin   Ogilvie's syndrome  Amandeep Hughes82 y.o.femalewith abdominal distension, Imaging shows stool in the right colon ,s/p colonic decompression on 04/21/17 , evidence of ischemia seen in the distal transverse colon , sigmoid non obstructing mass seen , tight turn at the recto sigmoid junction. Per radiology this area appears inflamed on CT scan although not reported so . Improved after removal of rectal tube on 04/24/17   Today I realized that everytime I have performed a rectal exam - she has had significant expulsion of gas and a large bowel movement . I again repeated a rectal exam at 2.30 pm with expulsion oif a very large qty of air, stool . Administered a soap sud enemas.  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 Discussed with Dr Rosana Hoes- options are botox to anal sphincter vs dilation with balloon of the anus vs lateral sphincterotomy . In the mean while suggest TID soap sud enema with manual disimpaction every time the abdomen appears distended.    LOS: 5 days   Kristen Oneill 04/24/2017, 10:39 AM

## 2017-04-24 NOTE — Progress Notes (Signed)
Patient ID: Kristen Oneill, female   DOB: July 22, 1935, 82 y.o.   MRN: [id number]  [facility] PROGRESS NOTE  Alayssa Flinchum WUJ:[id number] DOB: 1934/12/10 DOA: 04/19/2017 PCP: Patient, No Pcp Per  HPI/Subjective: Patient states that she's not feeling abdominal pain today. But she is more distended this morning when I saw her. Had bowel movements last night but none today when I saw her this morning. Dr. Vicente Males gastroenterology did see her this afternoon and did a rectal exam with good response.  Objective: Vitals:   04/24/17 0817 04/24/17 1231  BP: (!) 149/69 120/66  Pulse: 93 92  Resp: 18 14  Temp: 98.1 F (36.7 C) 98 F (36.7 C)  SpO2: 96% 94%    Filed Weights   04/19/17 0933 04/21/17 1123 04/24/17 1115  Weight: 75.8 kg (167 lb) 75.8 kg (167 lb) 76.6 kg (168 lb 14.4 oz)    ROS: Review of Systems  Constitutional: Negative for chills and fever.  Eyes: Negative for blurred vision.  Respiratory: Negative for cough and shortness of breath.   Cardiovascular: Negative for chest pain.  Gastrointestinal: Positive for constipation. Negative for abdominal pain, diarrhea, nausea and vomiting.  Genitourinary: Negative for dysuria.  Musculoskeletal: Negative for joint pain.  Neurological: Negative for dizziness and headaches.   Exam: Physical Exam  HENT:  Nose: No mucosal edema.  Mouth/Throat: Uvula swelling present. No oropharyngeal exudate or posterior oropharyngeal edema.  Eyes: Pupils are equal, round, and reactive to light. Conjunctivae, EOM and lids are normal.  Neck: No JVD present. Carotid bruit is not present. No edema present. No thyroid mass and no thyromegaly present.  Cardiovascular: S1 normal and S2 normal.  Exam reveals no gallop.   No murmur heard. Pulses:      Dorsalis pedis pulses are 2+ on the right side, and 2+ on the left side.  Respiratory: No respiratory distress. She has decreased breath sounds in the right lower field and the left lower field. She has no  wheezes. She has no rhonchi. She has no rales.  GI: Soft. Bowel sounds are normal. She exhibits distension. There is tenderness.  Musculoskeletal:       Right ankle: She exhibits swelling.       Left ankle: She exhibits swelling.  Lymphadenopathy:    She has no cervical adenopathy.  Neurological: She is alert. No cranial nerve deficit.  Skin: Skin is warm. Nails show no clubbing.  Stage I decubitus ulcer on buttock  Psychiatric: She has a normal mood and affect.      Data Reviewed: Basic Metabolic Panel:  Recent Labs Lab 04/19/17 0948 04/20/17 0315 04/22/17 0452 04/23/17 0605 04/24/17 0605  NA 135 137 138 140 138  K 3.2* 4.0 3.0* 3.3* 3.4*  CL 95* 98* 102 103 100*  CO2 31 30 29 30 30   GLUCOSE 91 104* 123* 116* 109*  BUN 13 13 8  <5* <5*  CREATININE 0.39* 0.39* 0.35* 0.33* 0.43*  CALCIUM 9.3 9.1 8.1* 8.8* 8.8*  MG 1.8  --   --  1.7  --   PHOS  --   --   --   --  2.5   Liver Function Tests:  Recent Labs Lab 04/19/17 0948  AST 16  ALT 8*  ALKPHOS 66  BILITOT 0.3  PROT 7.1  ALBUMIN 3.4*    Recent Labs Lab 04/19/17 0948  LIPASE 16   CBC:  Recent Labs Lab 04/19/17 0948 04/20/17 0315 04/22/17 0452 04/24/17 0605  WBC 9.8 10.7 5.8 6.0  NEUTROABS  7.7*  --   --   --   HGB 13.2 14.2 12.3 12.6  HCT 38.1 40.8 34.9* 36.7  MCV 90.8 93.4 92.3 93.2  PLT 209 204 239 235   Cardiac Enzymes:  Recent Labs Lab 04/19/17 0948  TROPONINI <0.03   BNP (last 3 results)  Recent Labs  04/19/17 0948  BNP 22.0     CBG:  Recent Labs Lab 04/21/17 2210 04/22/17 0025 04/22/17 0614 04/22/17 1143 04/22/17 1757  GLUCAP 165* 149* 114* 108* 117*    Recent Results (from the past 240 hour(s))  MRSA PCR Screening     Status: None   Collection Time: 04/19/17  3:11 PM  Result Value Ref Range Status   MRSA by PCR NEGATIVE NEGATIVE Final    Comment:        The GeneXpert MRSA Assay (FDA approved for NASAL specimens only), is one component of a comprehensive MRSA  colonization surveillance program. It is not intended to diagnose MRSA infection nor to guide or monitor treatment for MRSA infections.      Studies: Ct Angio Abd/pel W/ And/or W/o  Result Date: 04/23/2017 CLINICAL DATA:  82 year old with abdominal distention and pain. Gastroenteritis or colitis suspected. EXAM: CT ANGIOGRAPHY ABDOMEN AND PELVIS WITH CONTRAST CONTRAST TECHNIQUE: Multidetector CT imaging of the abdomen and pelvis was performed using the standard protocol during bolus administration of intravenous contrast. Multiplanar reconstructed images and MIPs were obtained and reviewed to evaluate the vascular anatomy. CONTRAST:  100 mL Isovue 370 COMPARISON:  04/16/2017 FINDINGS: VASCULAR Aorta: Atherosclerotic calcifications in the abdominal aorta without dissection or aneurysm. Celiac: Patent without evidence of aneurysm, dissection, vasculitis or significant stenosis. SMA: Patent without evidence of aneurysm, dissection, vasculitis or significant stenosis. Renals: Both renal arteries are patent without evidence of aneurysm, dissection, vasculitis, fibromuscular dysplasia or significant stenosis. IMA: Patent without evidence of aneurysm, dissection, vasculitis or significant stenosis. Inflow: Patent without evidence of aneurysm, dissection, vasculitis or significant stenosis. Proximal Outflow: Proximal femoral arteries are patent bilaterally. Veins: Portal venous system is patent.  IVC is patent. Review of the MIP images confirms the above findings. NON-VASCULAR Lower chest: Trace bilateral pleural fluid. Patchy parenchymal densities at the right lung base concerning for infection or inflammation. Right lung disease is new. Hepatobiliary: Gallbladder has been removed. Minimal intrahepatic biliary dilatation. No suspicious liver lesions. Pancreas: Normal appearance of the pancreas without inflammation or duct dilatation. Spleen: Normal appearance of spleen without enlargement. Adrenals/Urinary  Tract: Adrenal glands are within normal limits. Evidence for left renal cysts. One cystic structure along the posterior left kidney is too small to definitively characterize. No hydronephrosis. Moderate distention of the urinary bladder. Stomach/Bowel: Increased small bowel dilatation compared to the recent comparison examination. Small bowel loops now measure up to 4.9 cm. Again noted is marked distension of the rectum and sigmoid colon. No evidence to suggest a volvulus. Again noted is a large amount of stool-like material in the rectum and sigmoid colon. Stool material has a fluid component based on the air-fluid level. Again noted is mild wall thickening in the rectum and sigmoid colon with mild pericolonic stranding. There is a decompressed loop of small bowel in the central abdomen on series 4, image 33. Lymphatic: No significant lymph node enlargement in the abdomen or pelvis. Reproductive: Status post hysterectomy. No adnexal masses. Other: No free fluid.  No free air. Musculoskeletal: No acute abnormality. IMPRESSION: VASCULAR Mild atherosclerotic disease in the abdomen and pelvis. No stenosis involving the mesenteric arteries. No evidence for mesenteric  ischemia. NON-VASCULAR Again noted is marked distension of the distal colon with mild wall thickening and mild pericolonic edema in the rectal region. Large amount of liquid stool in the rectum and sigmoid colon. Findings could be associated with chronic constipation. Difficult to exclude mild colitis. Increased small bowel distension compared to the recent comparison examination. There is concern for a possible transition point in the mid abdomen and findings could be associated with an ileus or partial small bowel obstruction. New parenchymal densities at the right lung base with trace pleural effusions. Findings are suggestive for right lower lung pneumonia. This is new since 04/16/2017. These results will be called to the ordering clinician or  representative by the Radiologist Assistant, and communication documented in the PACS or zVision Dashboard. Electronically Signed   By: Markus Daft M.D.   On: 04/23/2017 10:33    Scheduled Meds: . aspirin  325 mg Oral Daily  . atorvastatin  20 mg Oral QPM  . carbidopa-levodopa  1 tablet Oral TID  . doxepin  10 mg Oral QHS  . feeding supplement (ENSURE ENLIVE)  237 mL Oral BID BM  . heparin  5,000 Units Subcutaneous Q8H  . lactulose  30 g Oral TID  . loratadine  10 mg Oral Daily  . magnesium oxide  400 mg Oral BID  . multivitamin with minerals  1 tablet Oral Daily  . OLANZapine  10 mg Oral QHS  . potassium chloride  20 mEq Oral BID  . primidone  50 mg Oral TID   Continuous Infusions: . sodium chloride Stopped (04/21/17 0815)  . ampicillin-sulbactam (UNASYN) IV    . dextrose 5 % and 0.9% NaCl 40 mL/hr at 04/23/17 1537    Assessment/Plan:  1. Abdominal distention,Ogilvie syndrome, severe constipation.  Anal sphincter is not relaxing. Dr. Vicente Males spoke with surgery to see if they are able to do Botox injections. The patient will need serial rectal exams. Unlikely this will be done in assisted living situation or at home. CT angiogram negative for ischemic colitis. Continue lactulose. Try to advance diet and see what happens. 2. Aspiration pneumonia- started on Unasyn 3. Parkinson's disease on Sinemet 4. Bipolar disorder on olanzapine 5. Hyperlipidemia unspecified on atorvastatin 6. Stage I decubitus ulcer on buttock. Change positions frequently. 7. Appreciate swallow evaluation 8. Uvula swollen. Continue Claritin. 9. Hypokalemia. Replace potassium orally. Also replace magnesium orally which should also help with bowel movements.  Code Status:     Code Status Orders        Start     Ordered   04/19/17 1419  Full code  Continuous     04/19/17 1418    Code Status History    Date Active Date Inactive Code Status Order ID Comments User Context   This patient has a current code  status but no historical code status.     Family Communication: Spoke with son on the phone Today and gave an update Disposition Plan: Likely will need skilled nursing care if the patient is going to need serial rectal exams to have bowel movements. This will unlikely be able to be done at home or at assisted living facility.  Consultants:  General surgery  Gastroenterology  Time spent: 26 minutes  [place], [place]

## 2017-04-24 NOTE — Progress Notes (Signed)
Initial Nutrition Assessment  DOCUMENTATION CODES:   Severe malnutrition in context of acute illness/injury  INTERVENTION:   If unable to advance past full liquid diet in 1-2 days recommend nutrition support as pt has been without adequate nutrition for >5 days.   Recommend check Phosphorus; pt at high refeeding risk  Ensure Enlive po BID, each supplement provides 350 kcal and 20 grams of protein  MVI  NUTRITION DIAGNOSIS:   Malnutrition (severe) related to acute illness, ileus as evidenced by energy intake < or equal to 50% for > or equal to 5 days, severe fluid accumulation in extremities, moderate depletions of muscle mass in clavicle regions.  GOAL:   Patient will meet greater than or equal to 90% of their needs  MONITOR:   PO intake, Supplement acceptance, Labs, Weight trends, I & O's  REASON FOR ASSESSMENT:   Diagnosis    ASSESSMENT:    82 y.o. female with a known history of Bipolar and Prkinson's disease- lives in [place], wheelchair bound and have 24 hr care taker for 10 yrs, no dementia, able to eat on her own. Evacuated and in assisted living facility since last week due to [facility].. 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 Pt also had poly biopsied at this time that was benign.   Met with pt in room today. Pt reports that she is normally a good eater at home and that she was eating well up until 1-2 days pta. Pt has been on NPO/liquid diet since admit and has not met her estimated needs for > 5 days. Pt reports that today, she ate some applesauce and drank some milk; tolerating well thus far. SLP consult pending. Pt is wheelchair bound at home and lives with a caretaker who prepares her meals. Pt does drink Ensure at home but not regularly. Pt reports that she does take a MVI daily. Pt with severe generalized edema; it is difficult to assess if  any true muscle or fat depeletions. There is no weight history documented for this pt; pt reports that her weight is stable. If unable to advance diet past liquids in 1-2 days recommend nutrition support. If able, recommend NGT and enteral feeds over TPN as using the GI system for enteral feeding helps prevent gut mucosal atrophy, reduces septic complications by decreasing bacterial translocation, stimulates gut motility therefore reducing the risk of ileus, and enhances the intestinal immune system. Pt is at high refeeding risk and may possibly be refeeding currently as pt on IVFs with Dextrose and eating small amounts; recommend check Phosphorus. Pt also with low potassium; monitor and supplement as needed per MD discretion. Spoke to pharmacy about electrolytes; pharmacy will discuss with MD.        Medications reviewed and include: aspirin, heparin, lactulose, Mg Oxide, KCl, zosyn, NaCl with 5% dextrose _0 /hr- provides 163kcal/day  Labs reviewed: K 3.4(L), Cl 100(L), BUN <5(L), creat 0.43(L), Ca 8.8(L), Mg 1.7 wnl  Nutrition-Focused physical exam completed. Findings are no fat depletion, moderate muscle depletions in clavicles, and severe generalized edema. RD unable to perform accurate assessment r/t severe fluid accumulation in extremities.   Diet Order:  Diet full liquid Room service appropriate? Yes with Assist; Fluid consistency: Thin  Skin:  Wound (see comment) (Stage I sacrum )  Last BM:  9/21  Height:   Ht Readings from Last 1 Encounters:  04/21/17 5' 6" (1.676 m)  Weight:   Wt Readings from Last 1 Encounters:  04/24/17 168 lb 14.4 oz (76.6 kg)    Ideal Body Weight:  59 kg  BMI:  Body mass index is 27.26 kg/m.  Estimated Nutritional Needs:   Kcal:  1500-1700kcal/day   Protein:  76-91g/day   Fluid:  >1.5L/day   EDUCATION NEEDS:   Education needs addressed  Koleen Distance MS, RD, LDN Pager #[phone] After Hours Pager: [phone]

## 2017-04-24 NOTE — Consult Note (Signed)
Patient request for prayer; Patient asleep both attempts to visit; will pass on to day Chaplain

## 2017-04-24 NOTE — NC FL2 (Signed)
[place] LEVEL OF CARE SCREENING TOOL     IDENTIFICATION  Patient Name: Kristen Oneill Birthdate: [birth year] Sex: female Admission Date (Current Location): 04/19/2017  [facility] and [place] Number:  Engineering geologist and Address:  [facility], [address], [place], [place] [address]      Provider Number: [id number]  Attending Physician Name and Address:  Loletha Grayer, MD  Relative Name and Phone Number:       Current Level of Care: Hospital Recommended Level of Care: [place] Prior Approval Number:    Date Approved/Denied:   PASRR Number:    Discharge Plan: SNF    Current Diagnoses: Patient Active Problem List   Diagnosis Date Noted  . Protein-calorie malnutrition, severe 04/24/2017  . Ogilvie's syndrome   . Pressure injury of skin 04/20/2017  . Ileus ([place]) 04/19/2017    Orientation RESPIRATION BLADDER Height & Weight     Self, Situation, Place  Normal Incontinent Weight: 168 lb 14.4 oz (76.6 kg) (bed weight) Height:  5\' 6"  (167.6 cm)  BEHAVIORAL SYMPTOMS/MOOD NEUROLOGICAL BOWEL NUTRITION STATUS   (none)  (none) Incontinent Diet (soft)  AMBULATORY STATUS COMMUNICATION OF NEEDS Skin   Extensive Assist Verbally PU Stage and Appropriate Care (stage 1 sacrum)                       Personal Care Assistance Level of Assistance  Bathing, Dressing, Feeding Bathing Assistance: Maximum assistance Feeding assistance: Limited assistance Dressing Assistance: Maximum assistance     Functional Limitations Info  Sight, Hearing Sight Info: Impaired Hearing Info: Impaired      SPECIAL CARE FACTORS FREQUENCY   (patient will require regular rectal exams to ensure stool is able to be released as patient has an anal sphincter stricture)                    Contractures Contractures Info: Not present    Additional Factors Info  Code Status, Allergies Code Status Info: ful Allergies Info: pcn's            Current Medications (04/24/2017):  This is the current hospital active medication list Current Facility-Administered Medications  Medication Dose Route Frequency Provider Last Rate Last Dose  . 0.9 %  sodium chloride infusion   Intravenous Continuous Jonathon Bellows, MD   Stopped at 04/21/17 0815  . acetaminophen (TYLENOL) tablet 650 mg  650 mg Oral Q6H PRN Lance Coon, MD   650 mg at 04/22/17 2203  . Ampicillin-Sulbactam (UNASYN) 3 g in sodium chloride 0.9 % 100 mL IVPB  3 g Intravenous Q6H Hallaji, Sheema M, RPH      . aspirin EC tablet 325 mg  325 mg Oral Daily Vaughan Basta, MD   325 mg at 04/24/17 1001  . atorvastatin (LIPITOR) tablet 20 mg  20 mg Oral QPM Vaughan Basta, MD   20 mg at 04/23/17 1700  . carbidopa-levodopa (SINEMET IR) 25-100 MG per tablet immediate release 1 tablet  1 tablet Oral TID Vaughan Basta, MD   1 tablet at 04/24/17 1001  . dextrose 5 %-0.9 % sodium chloride infusion   Intravenous Continuous Loletha Grayer, MD 40 mL/hr at 04/23/17 1537    . docusate sodium (COLACE) capsule 100 mg  100 mg Oral BID PRN Vaughan Basta, MD      . doxepin (SINEQUAN) capsule 10 mg  10 mg Oral QHS Hugelmeyer, Alexis, DO   10 mg at 04/23/17 2122  . feeding supplement (  ENSURE ENLIVE) (ENSURE ENLIVE) liquid 237 mL  237 mL Oral BID BM Wieting, Richard, MD   237 mL at 04/24/17 1332  . heparin injection 5,000 Units  5,000 Units Subcutaneous Q8H Vaughan Basta, MD   5,000 Units at 04/24/17 1329  . lactulose (CHRONULAC) 10 GM/15ML solution 30 g  30 g Oral TID Loletha Grayer, MD   30 g at 04/24/17 1001  . loratadine (CLARITIN) tablet 10 mg  10 mg Oral Daily Loletha Grayer, MD   10 mg at 04/24/17 1001  . magnesium oxide (MAG-OX) tablet 400 mg  400 mg Oral BID Loletha Grayer, MD   400 mg at 04/24/17 1001  . multivitamin with minerals tablet 1 tablet  1 tablet Oral Daily Loletha Grayer, MD   1 tablet at 04/24/17 1328  . OLANZapine (ZYPREXA)  tablet 10 mg  10 mg Oral QHS Vaughan Basta, MD   10 mg at 04/23/17 2312  . ondansetron (ZOFRAN) injection 4 mg  4 mg Intravenous Q6H PRN Jonathon Bellows, MD   4 mg at 04/21/17 1651  . oxyCODONE (Oxy IR/ROXICODONE) immediate release tablet 5 mg  5 mg Oral Q6H PRN Lance Coon, MD      . phenol (CHLORASEPTIC) mouth spray 1 spray  1 spray Mouth/Throat PRN Vaughan Basta, MD      . potassium chloride SA (K-DUR,KLOR-CON) CR tablet 20 mEq  20 mEq Oral BID Loletha Grayer, MD   20 mEq at 04/24/17 1000  . primidone (MYSOLINE) tablet 50 mg  50 mg Oral TID Vaughan Basta, MD   50 mg at 04/24/17 1001     Discharge Medications: Please see discharge summary for a list of discharge medications.  Relevant Imaging Results:  Relevant Lab Results:   Additional Information ss: [id number]  Shela Leff, LCSW

## 2017-04-24 NOTE — Progress Notes (Signed)
Palliative Medicine consult noted. Due to high referral volume, there may be a delay seeing this patient. Please call the Palliative Medicine Team office at [phone] if recommendations are needed in the interim.  Thank you for inviting [place] to see this patient.  Marjie Skiff Vonette Grosso, RN, BSN, [facility] 04/24/2017 3:59 PM Cell [phone] 8:00-4:00 Monday-Friday Office [phone]

## 2017-04-24 NOTE — Clinical Social Work Note (Signed)
CSW still following. Physician is consulting Palliative Care at this time.  Shela Leff MSW,LCSW [phone]

## 2017-04-25 ENCOUNTER — Inpatient Hospital Stay: Payer: Medicare Other

## 2017-04-25 NOTE — Progress Notes (Signed)
Patient is awake, but keeps eyes closed, and would not reply verbally. When attempting to administer oral medications, patient would turn her head away each time. When attempting to assess pupils, patient clenched her eyes shut. Dr. Tressia Miners was notified of this while she was present on the unit.    Soap suds enema was administered per order. Patient reached for the bed rail when she was told that we were going to turn her onto her left side. Lots of air and a small amount of stoll was expelled post enema. Abdomen appeared to be less distended and more soft.

## 2017-04-25 NOTE — Progress Notes (Signed)
Patient's son has been at the bedside since about 1300. Patient has been awake and talking. Currently sipping Ensure.

## 2017-04-25 NOTE — Progress Notes (Signed)
Jonathon Bellows MD, MRCP(U.K) [address]  [place]  [place], [place] [address]  Main: [phone]    Kristen Oneill is being followed for abdominal distension   Subjective: She said that she has had a few enemas and expelled gas, abdomen feels better    Objective: Vital signs in last 24 hours: Vitals:   04/24/17 1231 04/24/17 2015 04/25/17 0523 04/25/17 1317  BP: 120/66 (!) 145/74 (!) 150/75 (!) 157/80  Pulse: 92 83 98 84  Resp: 14  18 20   Temp: 98 F (36.7 C) 98.5 F (36.9 C) 99 F (37.2 C) 98.8 F (37.1 C)  TempSrc: Oral Oral Oral Oral  SpO2: 94% 97% 92% 95%  Weight:      Height:       Weight change:   Intake/Output Summary (Last 24 hours) at 04/25/17 1644 Last data filed at 04/25/17 1313  Gross per 24 hour  Intake          1228.66 ml  Output                0 ml  Net          1228.66 ml     Exam: Heart:: Regular rate and rhythm, S1S2 present or without murmur or extra heart sounds Lungs: normal, clear to auscultation and clear to auscultation and percussion Abdomen: moderately distended , not tense, soft , no guarding or rigidity BS+   Lab Results: @LABTEST2 @ Micro Results: Recent Results (from the past 240 hour(s))  MRSA PCR Screening     Status: None   Collection Time: 04/19/17  3:11 PM  Result Value Ref Range Status   MRSA by PCR NEGATIVE NEGATIVE Final    Comment:        The GeneXpert MRSA Assay (FDA approved for NASAL specimens only), is one component of a comprehensive MRSA colonization surveillance program. It is not intended to diagnose MRSA infection nor to guide or monitor treatment for MRSA infections.    Studies/Results: Ct Head Wo Contrast  Result Date: 04/25/2017 CLINICAL DATA:  82 year old female with unexplained altered mental status. Parkinson disease. EXAM: CT HEAD WITHOUT CONTRAST TECHNIQUE: Contiguous axial images were obtained from the base of the skull through the vertex without intravenous contrast. COMPARISON:   None. FINDINGS: Brain: Cerebral volume loss appears generalized. Diffuse ventricular prominence, probably ex vacuo related. Superimposed confluent bilateral cerebral white matter hypodensity. There is encephalomalacia at the left anterior temporal lobe tip best seen on sagittal image 42. No other cortical encephalomalacia identified ; probable sulcal variation along the left parieto-occipital sulcus (sagittal image 33). No midline shift, mass effect, evidence of mass lesion, intracranial hemorrhage or evidence of cortically based acute infarction. Vascular: Some intracranial artery dolichoectasia and calcified atherosclerosis. No suspicious intracranial vascular hyperdensity. Skull: Osteopenia.  No acute osseous abnormality identified. Sinuses/Orbits: Clear. Other: Visualized orbits and scalp soft tissues are within normal limits. IMPRESSION: 1.  No acute intracranial abnormality. 2. Cerebral volume loss with probably ex vacuo ventricular enlargement, advanced bilateral cerebral white matter disease, and left anterior temporal lobe encephalomalacia. Electronically Signed   By: Genevie Ann M.D.   On: 04/25/2017 12:46   Medications: I have reviewed the patient's current medications. Scheduled Meds: . aspirin  325 mg Oral Daily  . atorvastatin  20 mg Oral QPM  . carbidopa-levodopa  1 tablet Oral TID  . doxepin  10 mg Oral QHS  . feeding supplement (ENSURE ENLIVE)  237 mL Oral BID BM  . heparin  5,000 Units  Subcutaneous Q8H  . lactulose  30 g Oral TID  . loratadine  10 mg Oral Daily  . magnesium oxide  400 mg Oral BID  . multivitamin with minerals  1 tablet Oral Daily  . OLANZapine  10 mg Oral QHS  . potassium chloride  20 mEq Oral BID  . primidone  50 mg Oral TID   Continuous Infusions: . sodium chloride Stopped (04/21/17 0815)  . ampicillin-sulbactam (UNASYN) IV Stopped (04/25/17 1340)  . dextrose 5 % and 0.9% NaCl 40 mL/hr at 04/24/17 1805   PRN Meds:.acetaminophen, docusate sodium, ondansetron  (ZOFRAN) IV, oxyCODONE, phenol   Assessment: Principal Problem:   Ileus (HCC) Active Problems:   Pressure injury of skin   Ogilvie's syndrome   Protein-calorie malnutrition, severe Kristen Hughes82 y.o.femalewith abdominal distension, Imaging shows stool in the right colon ,s/p colonic decompression on 04/21/17 , evidence of ischemia seen in the distal transverse colon , sigmoid non obstructing mass seen , tight turn at the recto sigmoid junction. Per radiology this area appears inflamed on CT scan although not reported so . 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 Options are botox to anal sphincter vs dilation with balloon of the anus vs lateral sphincterotomy . In the mean while suggest TID soap sud enema with manual disimpaction every time the abdomen appears distended. Discussed with Dr Rosana Hoes who will be discussing with Dr Dahlia Byes for botox.   In the interim suggest nursing to perform rectal exam 3-4 times a day to expel gas due to tight anus in addition to enemas.    LOS: 6 days   Jonathon Bellows 04/25/2017, 4:44 PM

## 2017-04-25 NOTE — Progress Notes (Signed)
[place] at [place] NAME: Kristen Oneill    MR#:  [id number]  DATE OF BIRTH:  12/16/1934  SUBJECTIVE:  CHIEF COMPLAINT:   Chief Complaint  Patient presents with  . Abdominal Pain   - Abdominal distention noted. Patient alert but unable to communicate this morning. Not following commands. Appears confused  REVIEW OF SYSTEMS:  Review of Systems  Unable to perform ROS: Mental status change    DRUG ALLERGIES:   Allergies  Allergen Reactions  . Penicillins     VITALS:  Blood pressure (!) 150/75, pulse 98, temperature 99 F (37.2 C), temperature source Oral, resp. rate 18, height 5\' 6"  (1.676 m), weight 76.6 kg (168 lb 14.4 oz), SpO2 92 %.  PHYSICAL EXAMINATION:  Physical Exam  GENERAL:  82 y.o.-year-old patient lying in the bed with no acute distress.  EYES: Pupils equal, round, reactive to light and accommodation. No scleral icterus. Extraocular muscles intact.  HEENT: Head atraumatic, normocephalic. Oropharynx and nasopharynx clear.  NECK:  Supple, no jugular venous distention. No thyroid enlargement, no tenderness.  LUNGS: Normal breath sounds bilaterally, no wheezing, rales,rhonchi or crepitation. No use of accessory muscles of respiration.  CARDIOVASCULAR: S1, S2 normal. No murmurs, rubs, or gallops.  ABDOMEN: Abdomen is distended, but soft and has hypoactive bowel sounds. No organomegaly or mass.  EXTREMITIES: No cyanosis, or clubbing. 1+ bilateral pedal edema present NEUROLOGIC: Alert, not following commands. Tracking with eyes. Nonverbal this morning. -. Lower extremities with foot drop and 1+ pedal edema noted  PSYCHIATRIC: The patient is alert but not communicating this morning SKIN: No obvious rash, lesion, or ulcer.    LABORATORY PANEL:   CBC  Recent Labs Lab 04/24/17 0605  WBC 6.0  HGB 12.6  HCT 36.7  PLT 235    ------------------------------------------------------------------------------------------------------------------  Chemistries   Recent Labs Lab 04/19/17 0948  04/23/17 0605 04/24/17 0605  NA 135  < > 140 138  K 3.2*  < > 3.3* 3.4*  CL 95*  < > 103 100*  CO2 31  < > 30 30  GLUCOSE 91  < > 116* 109*  BUN 13  < > <5* <5*  CREATININE 0.39*  < > 0.33* 0.43*  CALCIUM 9.3  < > 8.8* 8.8*  MG 1.8  --  1.7  --   AST 16  --   --   --   ALT 8*  --   --   --   ALKPHOS 66  --   --   --   BILITOT 0.3  --   --   --   < > = values in this interval not displayed. ------------------------------------------------------------------------------------------------------------------  Cardiac Enzymes  Recent Labs Lab 04/19/17 0948  TROPONINI <0.03   ------------------------------------------------------------------------------------------------------------------  RADIOLOGY:  No results found.  EKG:   Orders placed or performed in visit on 04/19/17  . EKG 12-Lead  . EKG 12-Lead  . EKG 12-Lead    ASSESSMENT AND PLAN:   82 year old female with past medical history significant for bipolar disease and Parkinson's disease presents to hospital secondary to abdominal distention.  #1 abdominal distention-secondary to constipation and Ogilvie syndrome -Appreciate GI consult. Status post colonic decompression on 04/21/2017. -Evidence of ischemic and noted in distal transverse colon, however CT angiogram negative -Rectal tube with minimal output-so it was removed. -Surgical consult requested. -Continue enemas as needed. Could be anal splinter issues- hypertonic and inability to relax. - This could be secondary to Parkinson's disease with autonomic  dysfunction. surgical consult for possible Botox injections or sphincter dilatation to see that would help.  #2 aspiration pneumonia-appreciate speech consult. On aspiration precautions and downgraded diet. -Continue Unasyn.  #3 Parkinson's  disease-continue Sinemet and primidone  #4 bipolar disease - patient on olanzapine  #5 DVT prophylaxis-subcutaneous heparin   PT consult and likely placement   All the records are reviewed and case discussed with Care Management/Social Workerr. Management plans discussed with the patient, family and they are in agreement.  CODE STATUS: Full code  TOTAL TIME TAKING CARE OF THIS PATIENT: 36 minutes.   POSSIBLE D/C IN 2-3 DAYS, DEPENDING ON CLINICAL CONDITION.   Gladstone Lighter M.D on 04/25/2017 at 10:22 AM  Between 7am to 6pm - Pager - [phone]  After 6pm go to [email or web address] - password [email or web address] [facility]  Office  [phone]  CC: Primary care physician; Patient, No Pcp Per

## 2017-04-25 NOTE — Progress Notes (Signed)
SLP Cancellation Note  Patient Details Name: Kristen Oneill MRN: [id number] DOB: [birth year]   Cancelled treatment:       Reason Eval/Treat Not Completed: Fatigue/lethargy limiting ability to participate. SLP attempted to awaken pt however she was too lethargic for safe PO.   [place] Sauber 04/25/2017, 10:22 AM

## 2017-04-26 MED ORDER — POTASSIUM CHLORIDE 20 MEQ PO PACK
20.0000 meq | PACK | Freq: Two times a day (BID) | ORAL | Status: DC
  Administered 2017-04-26: 20 meq via ORAL
  Filled 2017-04-26: qty 1

## 2017-04-26 NOTE — Progress Notes (Signed)
Pt. had enema x 2 and rectal exam x 2 today. Pt. Refused meal trays. Pt. Was also coughing up and vomited some phlegm contents. No signs of acute distress.

## 2017-04-26 NOTE — Progress Notes (Signed)
Pt BP elevated at 146/106. Pt has no complaints of pain nor SOB. Pt resting quietly . Primary nurse notified Dr. Marcille Blanco of BP. No new orders received. Primary nurse to continue to monitor.

## 2017-04-26 NOTE — Progress Notes (Signed)
[place] at [place] NAME: Kristen Oneill    MR#:  [id number]  DATE OF BIRTH:  [birth year]  SUBJECTIVE:  CHIEF COMPLAINT:   Chief Complaint  Patient presents with  . Abdominal Pain   - Abdomen is distended but very soft and exam. Rectal exam being done 2-3 times a day by RN as recommended -Patient usually does not want to talk, but did say that she will take her medications later  REVIEW OF SYSTEMS:  Review of Systems  Unable to perform ROS: Mental status change    DRUG ALLERGIES:   Allergies  Allergen Reactions  . Penicillins     VITALS:  Blood pressure (!) 146/106, pulse 99, temperature 98.2 F (36.8 C), temperature source Oral, resp. rate (!) 22, height 5\' 6"  (1.676 m), weight 76.6 kg (168 lb 14.4 oz), SpO2 93 %.  PHYSICAL EXAMINATION:  Physical Exam  GENERAL:  82 y.o.-year-old patient lying in the bed with no acute distress.  EYES: Pupils equal, round, reactive to light and accommodation. No scleral icterus. Extraocular muscles intact.  HEENT: Head atraumatic, normocephalic. Oropharynx and nasopharynx clear.  NECK:  Supple, no jugular venous distention. No thyroid enlargement, no tenderness.  LUNGS: Normal breath sounds bilaterally, no wheezing, rales,rhonchi or crepitation. No use of accessory muscles of respiration.  CARDIOVASCULAR: S1, S2 normal. No murmurs, rubs, or gallops.  ABDOMEN: Abdomen is distended, but soft and has hypoactive bowel sounds. No organomegaly or mass.  EXTREMITIES: No cyanosis, or clubbing. 1+ bilateral pedal edema present NEUROLOGIC: Alert, not following commands. Because she does not want to. Tracking with eyes. Nonverbal mostly but able to communicate that she does not want to take her medications. -. Lower extremities with foot drop and 1+ pedal edema noted  PSYCHIATRIC: The patient is alert but not communicating SKIN: No obvious rash, lesion, or ulcer.    LABORATORY PANEL:   CBC  Recent  Labs Lab 04/24/17 0605  WBC 6.0  HGB 12.6  HCT 36.7  PLT 235   ------------------------------------------------------------------------------------------------------------------  Chemistries   Recent Labs Lab 04/23/17 0605 04/24/17 0605  NA 140 138  K 3.3* 3.4*  CL 103 100*  CO2 30 30  GLUCOSE 116* 109*  BUN <5* <5*  CREATININE 0.33* 0.43*  CALCIUM 8.8* 8.8*  MG 1.7  --    ------------------------------------------------------------------------------------------------------------------  Cardiac Enzymes No results for input(s): TROPONINI in the last 168 hours. ------------------------------------------------------------------------------------------------------------------  RADIOLOGY:  Ct Head Wo Contrast  Result Date: 04/25/2017 CLINICAL DATA:  82 year old female with unexplained altered mental status. Parkinson disease. EXAM: CT HEAD WITHOUT CONTRAST TECHNIQUE: Contiguous axial images were obtained from the base of the skull through the vertex without intravenous contrast. COMPARISON:  None. FINDINGS: Brain: Cerebral volume loss appears generalized. Diffuse ventricular prominence, probably ex vacuo related. Superimposed confluent bilateral cerebral white matter hypodensity. There is encephalomalacia at the left anterior temporal lobe tip best seen on sagittal image 42. No other cortical encephalomalacia identified ; probable sulcal variation along the left parieto-occipital sulcus (sagittal image 33). No midline shift, mass effect, evidence of mass lesion, intracranial hemorrhage or evidence of cortically based acute infarction. Vascular: Some intracranial artery dolichoectasia and calcified atherosclerosis. No suspicious intracranial vascular hyperdensity. Skull: Osteopenia.  No acute osseous abnormality identified. Sinuses/Orbits: Clear. Other: Visualized orbits and scalp soft tissues are within normal limits. IMPRESSION: 1.  No acute intracranial abnormality. 2. Cerebral volume  loss with probably ex vacuo ventricular enlargement, advanced bilateral cerebral white matter disease, and left anterior  temporal lobe encephalomalacia. Electronically Signed   By: Genevie Ann M.D.   On: 04/25/2017 12:46   Dg Abd Portable 1v  Result Date: 04/25/2017 CLINICAL DATA:  Abdominal distention EXAM: PORTABLE ABDOMEN - 1 VIEW COMPARISON:  04/21/2017, 04/23/2017 FINDINGS: Diffuse gaseous distension of bowel is noted involving the stomach, small bowel, and large bowel. Mild improvement from the prior studies. Surgical clips in the gallbladder fossa. IMPRESSION: Severe adynamic ileus with mild interval improvement. Electronically Signed   By: Franchot Gallo M.D.   On: 04/25/2017 17:31    EKG:   Orders placed or performed in visit on 04/19/17  . EKG 12-Lead  . EKG 12-Lead  . EKG 12-Lead    ASSESSMENT AND PLAN:   82 year old female with past medical history significant for bipolar disease and Parkinson's disease presents to hospital secondary to abdominal distention.  #1 abdominal distention-secondary to constipation, hypertonic anal sphincter and Ogilvie syndrome -Appreciate GI consult. Status post colonic decompression on 04/21/2017. -Evidence of ischemic changes noted in distal transverse colon  -Rectal tube with minimal output-so it was removed. -Continue tid enemas and also rectal exam 2-3 times/day for gas passage. - hypertonic anal sphincter could be secondary to Parkinson's disease with autonomic dysfunction. surgical consult for possible Botox injections or sphincter dilatation to see that would help.  #2 aspiration pneumonia-appreciate speech consult. On aspiration precautions and downgraded diet. -Continue Unasyn.  #3 AMS- metabolic encephalopathy, underlying dementia cannot be ruled out - CT head yesterday with  No acute findings, but has cerebral volume loss, with ventricular enlargement and advanced white matter disease. Also left anterior temporal lobe  encephalomalacia. -Neurology consult requested to rule out underlying seizures. -Has Parkinson's disease-continue Sinemet and primidone  #4 bipolar disease - patient on olanzapine  #5 DVT prophylaxis-subcutaneous heparin   PT consult and likely placement Updated son yesterday   All the records are reviewed and case discussed with Care Management/Social Workerr. Management plans discussed with the patient, family and they are in agreement.  CODE STATUS: Full code  TOTAL TIME TAKING CARE OF THIS PATIENT: 36 minutes.   POSSIBLE D/C IN 2-3 DAYS, DEPENDING ON CLINICAL CONDITION.   Gladstone Lighter M.D on 04/26/2017 at 10:06 AM  Between 7am to 6pm - Pager - [phone]  After 6pm go to [email or web address] - password [email or web address] [facility]  Office  [phone]  CC: Primary care physician; Patient, No Pcp Per

## 2017-04-26 NOTE — Clinical Social Work Note (Signed)
The CSW attempted to contact the patient's son to discuss discharge planning. The CSW left a HIPPA compliant voicemail asking for a return call either to this CSW or the M-F CSW. CSW will continue to follow.  Santiago Bumpers, MSW, Latanya Presser [phone]

## 2017-04-26 NOTE — Plan of Care (Signed)
Problem: Fluid Volume: Goal: Ability to maintain a balanced intake and output will improve Outcome: Not Progressing Refused to eat.

## 2017-04-27 DIAGNOSIS — R4182 Altered mental status, unspecified: Secondary | ICD-10-CM

## 2017-04-27 DIAGNOSIS — Z7189 Other specified counseling: Secondary | ICD-10-CM

## 2017-04-27 DIAGNOSIS — Z515 Encounter for palliative care: Secondary | ICD-10-CM

## 2017-04-27 LAB — CBC
HCT: 35.7 % (ref 35.0–47.0)
Hemoglobin: 12.2 g/dL (ref 12.0–16.0)
MCH: 31.5 pg (ref 26.0–34.0)
MCHC: 34.2 g/dL (ref 32.0–36.0)
MCV: 92 fL (ref 80.0–100.0)
PLATELETS: 304 10*3/uL (ref 150–440)
RBC: 3.87 MIL/uL (ref 3.80–5.20)
RDW: 13.6 % (ref 11.5–14.5)
WBC: 9 10*3/uL (ref 3.6–11.0)

## 2017-04-27 LAB — COMPREHENSIVE METABOLIC PANEL
ALK PHOS: 68 U/L (ref 38–126)
ALT: 7 U/L — AB (ref 14–54)
AST: 24 U/L (ref 15–41)
Albumin: 2.8 g/dL — ABNORMAL LOW (ref 3.5–5.0)
Anion gap: 8 (ref 5–15)
BILIRUBIN TOTAL: 0.3 mg/dL (ref 0.3–1.2)
BUN: <5 mg/dL — ABNORMAL LOW (ref 6–20)
CALCIUM: 8.9 mg/dL (ref 8.9–10.3)
CO2: 29 mmol/L (ref 22–32)
CREATININE: 0.38 mg/dL — AB (ref 0.44–1.00)
Chloride: 101 mmol/L (ref 101–111)
Glucose, Bld: 122 mg/dL — ABNORMAL HIGH (ref 65–99)
Potassium: 4.2 mmol/L (ref 3.5–5.1)
Sodium: 138 mmol/L (ref 135–145)
Total Protein: 6.4 g/dL — ABNORMAL LOW (ref 6.5–8.1)

## 2017-04-27 MED ORDER — GLYCOPYRROLATE 0.2 MG/ML IJ SOLN
0.2000 mg | INTRAMUSCULAR | Status: DC | PRN
  Filled 2017-04-27: qty 1

## 2017-04-27 MED ORDER — MORPHINE SULFATE (CONCENTRATE) 10 MG/0.5ML PO SOLN: 5.0000 mg | ORAL | Status: DC | PRN

## 2017-04-27 MED ORDER — OLANZAPINE 10 MG PO TBDP
10.0000 mg | ORAL_TABLET | Freq: Every day | ORAL | Status: DC
  Filled 2017-04-27: qty 1

## 2017-04-27 MED ORDER — OXYCODONE HCL 5 MG PO TABS: 5.0000 mg | ORAL_TABLET | ORAL | Status: DC | PRN

## 2017-04-27 MED ORDER — HYDROCORTISONE 2.5 % RE CREA
TOPICAL_CREAM | Freq: Two times a day (BID) | RECTAL | Status: DC
  Administered 2017-04-27 (×2): via RECTAL
  Administered 2017-04-27: 1 via RECTAL
  Filled 2017-04-27: qty 28.35
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
## 2017-04-27 NOTE — Progress Notes (Signed)
Kristen Oneill at [place] NAME: Kristen Oneill    MR#:  [id number]  DATE OF BIRTH:  09-06-1934  SUBJECTIVE:  CHIEF COMPLAINT:   Chief Complaint  Patient presents with  . Abdominal Pain   - more alert and answering some simple questions today - however concerns of aspiration since last night as unable to clear her secretions - continues to need tid enemas and Rectal exams  REVIEW OF SYSTEMS:  Review of Systems  Unable to perform ROS: Mental status change    DRUG ALLERGIES:   Allergies  Allergen Reactions  . Penicillins     VITALS:  Blood pressure (!) 150/66, pulse 94, temperature (!) 97.5 F (36.4 C), temperature source Oral, resp. rate 18, height 5\' 6"  (1.676 m), weight 76.6 kg (168 lb 14.4 oz), SpO2 96 %.  PHYSICAL EXAMINATION:  Physical Exam  GENERAL:  82 y.o.-year-old patient lying in the bed with no acute distress.  EYES: Pupils equal, round, reactive to light and accommodation. No scleral icterus. Extraocular muscles intact.  HEENT: Head atraumatic, normocephalic. Oropharynx and nasopharynx clear.  NECK:  Supple, no jugular venous distention. No thyroid enlargement, no tenderness.  LUNGS: Normal breath sounds bilaterally, no wheezing, rales,rhonchi or crepitation. No use of accessory muscles of respiration.  CARDIOVASCULAR: S1, S2 normal. No murmurs, rubs, or gallops.  ABDOMEN: Abdomen is distended, but soft and has hypoactive bowel sounds. No organomegaly or mass.  EXTREMITIES: No cyanosis, or clubbing. 1+ bilateral pedal edema present NEUROLOGIC: Alert, Occasionally following some simple commands.  Nonverbal mostly but able to communicate if she wants to. -. Lower extremities with foot drop and 1+ pedal edema noted  PSYCHIATRIC: The patient is alert And able to answer some simple questions SKIN: No obvious rash, lesion, or ulcer.    LABORATORY PANEL:   CBC  Recent Labs Lab 04/27/17 0309  WBC 9.0  HGB 12.2  HCT  35.7  PLT 304   ------------------------------------------------------------------------------------------------------------------  Chemistries   Recent Labs Lab 04/23/17 0605  04/27/17 0309  NA 140  < > 138  K 3.3*  < > 4.2  CL 103  < > 101  CO2 30  < > 29  GLUCOSE 116*  < > 122*  BUN <5*  < > <5*  CREATININE 0.33*  < > 0.38*  CALCIUM 8.8*  < > 8.9  MG 1.7  --   --   AST  --   --  24  ALT  --   --  7*  ALKPHOS  --   --  68  BILITOT  --   --  0.3  < > = values in this interval not displayed. ------------------------------------------------------------------------------------------------------------------  Cardiac Enzymes No results for input(s): TROPONINI in the last 168 hours. ------------------------------------------------------------------------------------------------------------------  RADIOLOGY:  Dg Abd Portable 1v  Result Date: 04/25/2017 CLINICAL DATA:  Abdominal distention EXAM: PORTABLE ABDOMEN - 1 VIEW COMPARISON:  04/21/2017, 04/23/2017 FINDINGS: Diffuse gaseous distension of bowel is noted involving the stomach, small bowel, and large bowel. Mild improvement from the prior studies. Surgical clips in the gallbladder fossa. IMPRESSION: Severe adynamic ileus with mild interval improvement. Electronically Signed   By: Franchot Gallo M.D.   On: 04/25/2017 17:31    EKG:   Orders placed or performed in visit on 04/19/17  . EKG 12-Lead  . EKG 12-Lead  . EKG 12-Lead    ASSESSMENT AND PLAN:   82 year old female with past medical history significant for bipolar disease and Parkinson's disease  presents to hospital secondary to abdominal distention.  #1 abdominal distention-secondary to constipation, hypertonic anal sphincter and Ogilvie syndrome -Appreciate GI consult. Status post colonic decompression on 04/21/2017. -Evidence of ischemic changes noted in distal transverse colon  -Rectal tube with minimal output-so it was removed. -Continue tid enemas and also  rectal exam 2-3 times/day for gas passage. - hypertonic anal sphincter could be secondary to Parkinson's disease with autonomic dysfunction. surgical consult for possible Botox injections or sphincter dilatation to see that would help.  #2 aspiration pneumonia- was on Unasyn, however increased oral secretions and C&S today. -Repeat speech consult. Nothing by mouth for now Discussion with family about aggressive care versus hospice  #3 AMS- metabolic encephalopathy, underlying dementia cannot be ruled out -Also has significant depression since her husband died about 2 months ago - CT head yesterday with  No acute findings, but has cerebral volume loss, with ventricular enlargement and advanced white matter disease. Also left anterior temporal lobe encephalomalacia. -Neurology consult appreciated -Has Parkinson's disease-continue Sinemet and primidone  #4 bipolar disease - patient on olanzapine  #5 DVT prophylaxis-subcutaneous heparin   Palliative care consulted with overall poor prognosis and clinical decline. -We'll address nutrition once family makes a decision about hospice versus aggressive management    All the records are reviewed and case discussed with Care Management/Social Workerr. Management plans discussed with the patient, family and they are in agreement.  CODE STATUS: DNR  TOTAL TIME TAKING CARE OF THIS PATIENT: 36 minutes.   POSSIBLE D/C IN 1-2 DAYS, DEPENDING ON CLINICAL CONDITION.   Gladstone Lighter M.D on 04/27/2017 at 2:59 PM  Between 7am to 6pm - Pager - [phone]  After 6pm go to [email or web address] - password [email or web address] [facility]  Office  [phone]  CC: Primary care physician; Patient, No Pcp Per
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
## 2017-04-27 NOTE — Progress Notes (Signed)
Nutrition Brief Note  Chart reviewed. Pt now transitioning to comfort care.  No further nutrition interventions warranted at this time.  Please re-consult as needed.   Nathanel Tallman MS, RD, LDN Pager #- [phone] After Hours Pager: [phone]    

## 2017-04-27 NOTE — Progress Notes (Signed)
364ml SS enema given.  Large amount of flatus expelled after digital stimulation.  Pt tolerated well, except external hemorrhoids noted and pt c/o rectal/hemorrhoidal pain post-procedure.  Dr Jannifer Franklin notified and orders received for hemorrhoid cream.

## 2017-04-27 NOTE — Progress Notes (Signed)
SLP Cancellation Note  Patient Details Name: Kristen Oneill MRN: [id number] DOB: [birth year]   Cancelled treatment:       Reason Eval/Treat Not Completed: Patient at procedure or test/unavailable;Medical issues which prohibited therapy (chart reviewed; consulted NSG ). NSG reported pt and family were in w/ Palliative Care re: goals of poc. Pt continues to be impacted by GI issues and unable to eat/drink per NSG. Will await updated and f/u accordingly. Recommend frequent oral care for hygiene and stimulation. NSG agreed.    Orinda Kenner, MS, CCC-SLP Tahjae Clausing 04/27/2017, 3:22 PM

## 2017-04-27 NOTE — Progress Notes (Signed)
New hospice home referral received from [place] following a Palliative Medicine consult. Patient is an 82 year old woman with a past medical history of Parkinson's and Bipolar disorder, admitted to [facility] from [facility] on 9/16 for evaluation of abdominal pain and distention. 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 She has endured the placement of a rectal tube and frequent rectal (TID) exams and enemas. She is also having increased oral secretions and not eating. Patient's son [name] met today with Palliative Medicine NP Vinie Sill and has chosen to focus on his mother's comfort with transfer to the hospice home. Writer met with patient's son [name] to initiate education regarding hospice services, philosophy and team approach to care with understanding voiced. Questions answered, consents signed. Emotional support given as [name] recalled his moving in with his parent's and his father's recent death. Patient has another son Elenore Rota, who lives in [place] and who, per [name] will be coming to [employer]. Plan is for transfer to the hospice home tomorrow via EMS. Hospital care team aware and in agreement. Signed DNR in place in patient's chart. Patient information faxed to referral. Will continue to follow through final disposition. Thank you.  Flo Shanks RN,BSN, [facility], hospital Liaison [phone] c

## 2017-04-27 NOTE — Progress Notes (Signed)
Initial Nutrition Assessment  DOCUMENTATION CODES:   Severe malnutrition in context of acute illness/injury  INTERVENTION:    Recommend nutrition support as pt has been without adequate nutrition for >7 days.  If possible, recommend enteral feeds over TPN as using the GI system for enteral feeding helps prevent gut mucosal atrophy, reduces septic complications by decreasing bacterial translocation, stimulates gut motility therefore reducing the risk of ileus, and enhances the intestinal immune system.   Pt at high refeeding risk  Ensure Enlive po BID, each supplement provides 350 kcal and 20 grams of protein  MVI  NUTRITION DIAGNOSIS:   Malnutrition (severe) related to acute illness, ileus/severe constipation as evidenced by energy intake < or equal to 50% for > or equal to 5 days, severe fluid accumulation in extremities, moderate depletions of muscle mass in clavicle regions.  GOAL:   Patient will meet greater than or equal to 90% of their needs  MONITOR:   PO intake, Supplement acceptance, Labs, Weight trends, I & O's  ASSESSMENT:    82 y.o. female with a known history of Bipolar and Prkinson's disease- lives in [place], wheelchair bound and have 24 hr care taker for 10 yrs, no dementia, able to eat on her own. Evacuated and in assisted living facility since last week due to [facility].. 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 Pt also had poly biopsied at this time that was benign.   Pt continues to do poorly; refusing meals and supplements. Pt has now been without adequate nutrition for > 7 days. Pt also eating poorly pta. Pt continues to have daily enemas. Now new weight since 9/21. Pt with edema in all extremities. Palliative care consult pending. At this point pt with severe malnutrition; recommend nutrition support via enteral feeds if possible.   Recommend enteral feeds over TPN as using the GI system for enteral feeding helps prevent gut mucosal atrophy, reduces septic complications by decreasing bacterial translocation, stimulates gut motility therefore reducing the risk of ileus, and enhances the intestinal immune system. Pt is at high refeeding risk.         Medications reviewed and include: aspirin, heparin, lactulose, Mg Oxide, MVI, KCl  Labs reviewed: BUN <5(L), creat 0.38(L), alb 2.8(L) P 2.5 wnl- 9/21 Mg 1.7 wnl- 9/20  Diet Order:  Diet NPO time specified  Skin:  Wound (see comment) (Stage I sacrum )  Last BM:  9/23- type 6  Height:   Ht Readings from Last 1 Encounters:  04/21/17 5\' 6"  (1.676 m)    Weight:   Wt Readings from Last 1 Encounters:  04/24/17 168 lb 14.4 oz (76.6 kg)    Ideal Body Weight:  59 kg  BMI:  Body mass index is 27.26 kg/m.  Estimated Nutritional Needs:   Kcal:  1500-1700kcal/day   Protein:  76-91g/day   Fluid:  >1.5L/day   EDUCATION NEEDS:   Education needs addressed  Koleen Distance MS, RD, LDN Pager #[phone] After Hours Pager: [phone]

## 2017-04-27 NOTE — Progress Notes (Signed)
Informed patient being transferred to hospice . Discussed management of hypertonic anal sphincter with Shaune Leeks NP when she goes to hospice.   I will sign off.  Please call me if any further GI concerns or questions.  We would like to thank you for the opportunity to participate in the care of [facility].  Dr Jonathon Bellows MD,MRCP [facility]) Gastroenterology/Hepatology Pager: [phone]

## 2017-04-27 NOTE — Progress Notes (Signed)
Pt now unable to clear own oral secretions.  Wet voice quality and strong choking cough with repositioning, especially after periods of sleep.  Oral suctioning performed prior to and post position changes.  Pt maintained w/ HOB>30 degrees.  Aspiration precautions maintained with all po intake.

## 2017-04-27 NOTE — Clinical Social Work Note (Signed)
CSW has spoken to patient's son and he informed CSW that he and his brother have chosen for their mother to stay at hospice home here in [place] rather than try to have their mother travel in an ambulance back to [place]. CSW has informed Santiago Glad with hospice and she will work patient up for transfer to hospice home.  Shela Leff MSW,LCSW [phone]

## 2017-04-28 MED ORDER — LORATADINE 10 MG PO TABS: 10.0000 mg | ORAL_TABLET | Freq: Every day | ORAL | Status: AC

## 2017-04-28 MED ORDER — ENSURE ENLIVE PO LIQD: 237.0000 mL | Freq: Two times a day (BID) | ORAL | 12 refills | Status: AC

## 2017-04-28 MED ORDER — HYDROCORTISONE 2.5 % RE CREA: TOPICAL_CREAM | Freq: Two times a day (BID) | RECTAL | 0 refills | Status: AC

## 2017-04-28 MED ORDER — GLYCOPYRROLATE 0.2 MG/ML IJ SOLN: 0.2000 mg | INTRAMUSCULAR | Status: AC | PRN

## 2017-04-28 MED ORDER — ACETAMINOPHEN 325 MG PO TABS: 650.0000 mg | ORAL_TABLET | Freq: Four times a day (QID) | ORAL | Status: AC | PRN

## 2017-04-28 MED ORDER — OXYCODONE HCL 5 MG PO TABS: 5.0000 mg | ORAL_TABLET | ORAL | 0 refills | Status: AC | PRN

## 2017-04-28 MED ORDER — LACTULOSE 10 GM/15ML PO SOLN: 30.0000 g | Freq: Three times a day (TID) | ORAL | 0 refills | Status: AC

## 2017-04-28 MED ORDER — OLANZAPINE 10 MG PO TBDP: 10.0000 mg | ORAL_TABLET | Freq: Every day | ORAL | Status: AC

## 2017-04-28 MED ORDER — MORPHINE SULFATE (CONCENTRATE) 10 MG/0.5ML PO SOLN: 5.0000 mg | ORAL | 0 refills | Status: AC | PRN

## 2017-04-28 NOTE — Progress Notes (Signed)
Pt discharged via MD Order. Pt taken to hospice via EMS. IV removed.

## 2017-04-28 NOTE — Progress Notes (Signed)
Follow up visit made to new hospice home referral. Patient seen , remains alert and awake. Audible secretions noted, she remains NPO and has not taken oral medications. Son [name] and grandson Nicki Reaper present and remain agreeable to transfer to the hospice home today. Report called to the Hospice home, EMS notified for transport. Hospital care team and [name] made aware. Signed DNR in place in patient's discharge packet. Thank you. Flo Shanks RN, BSN, [facility] of [place], hospital Liaison [phone] c

## 2017-04-28 NOTE — Discharge Summary (Signed)
[facility] NAME: Kristen Oneill    MR#:  [id number]  DATE OF BIRTH:  Aug 03, 1935  DATE OF ADMISSION:  04/19/2017 ADMITTING PHYSICIAN: Vaughan Basta, MD  DATE OF DISCHARGE:  04/28/17  PRIMARY CARE PHYSICIAN: Patient, No Pcp Per    ADMISSION DIAGNOSIS:  Ileus ([place]) [K56.7] Abdominal pain, unspecified abdominal location [R10.9]  DISCHARGE DIAGNOSIS:  Principal Problem:   Ileus ([place]) Active Problems:   Pressure injury of skin   Ogilvie's syndrome   Protein-calorie malnutrition, severe  Hospice care with comfort measeures SECONDARY DIAGNOSIS:   Past Medical History:  Diagnosis Date  . Bipolar 1 disorder ([place])   . Parkinson disease [facility])     HOSPITAL COURSE:   HISTORY OF PRESENT ILLNESS: Kristen Oneill  is a 82 y.o. female with a known history of Bipolar and Prkinson's disease- lives in [place], wheelchair bound and have 24 hr care taker for 10 yrs, no dementia, able to eat on her own. Evacuated and in assisted living facility since last week due to [facility]. Came to ER 3 days ago for abdominal distention and pain- found colonic distention and stool, sent home. Came today again, with worsening distention and pain in legs. Xray confirms ileus. ER spoke to GI- suggested NG and rectal tubes and may do colonoscopy in 1-2 days. Leg pain is new , not legs, no injuries.  #1 abdominal distention-secondary to constipation, hypertonic anal sphincter and Ogilvie syndrome -Appreciate GI consult. Status post colonic decompression on 04/21/2017. -Evidence of ischemic changes noted in distal transverse colon  -Rectal tube with minimal output-so it was removed. -Continue tid enemas and also rectal exam 2-3 times/day for gas passage. - hypertonic anal sphincter could be secondary to Parkinson's disease with autonomic dysfunction. surgical consult for possible Botox injections or sphincter dilatation to see that would help. In  the interim patient is changed to comfort care measures after discussing with the hospice care  #2 aspiration pneumonia- was on Unasyn, however increased oral secretions and C&S today. - speech therapy consulted  Discussion with family about aggressive care versus hospice. Family has decided towards hospice care  #3 AMS- metabolic encephalopathy, underlying dementia cannot be ruled out -Also has significant depression since her husband died about 2 months ago - CT head yesterday with  No acute findings, but has cerebral volume loss, with ventricular enlargement and advanced white matter disease. Also left anterior temporal lobe encephalomalacia. -Neurology consult appreciated -Has Parkinson's disease-continue Sinemet and primidone  #4 bipolar disease - patient on olanzapine  #5 DVT prophylaxis-subcutaneous heparin during the hospital course   Palliative care consulted with overall poor prognosis and clinical decline. Patient and her 2 sons are agreeable with hospice home and comfort care measures Planning to transfer the patient to hospice home soon of the bed is available today   DISCHARGE CONDITIONS:   fair  CONSULTS OBTAINED:  Treatment Team:  Leotis Pain, MD   PROCEDURES Colonic decompression  DRUG ALLERGIES:   Allergies  Allergen Reactions  . Penicillins     DISCHARGE MEDICATIONS:   Current Discharge Medication List    START taking these medications   Details  acetaminophen (TYLENOL) 325 MG tablet Take 2 tablets (650 mg total) by mouth every 6 (six) hours as needed for moderate pain (headache).    feeding supplement, ENSURE ENLIVE, (ENSURE ENLIVE) LIQD Take 237 mLs by mouth 2 (two) times daily between meals. Qty: 237 mL, Refills: 12    glycopyrrolate (ROBINUL) 0.2 MG/ML  injection Inject 1 mL (0.2 mg total) into the vein every 4 (four) hours as needed (secretions, gurgling). Qty: 1 mL    hydrocortisone (ANUSOL-HC) 2.5 % rectal cream Place rectally 2  (two) times daily. Qty: 30 g, Refills: 0    lactulose (CHRONULAC) 10 GM/15ML solution Take 45 mLs (30 g total) by mouth 3 (three) times daily. Qty: 240 mL, Refills: 0    loratadine (CLARITIN) 10 MG tablet Take 1 tablet (10 mg total) by mouth daily.    Morphine Sulfate (MORPHINE CONCENTRATE) 10 MG/0.5ML SOLN concentrated solution Take 0.25 mLs (5 mg total) by mouth every 2 (two) hours as needed for severe pain or shortness of breath. Qty: 180 mL, Refills: 0    OLANZapine zydis (ZYPREXA) 10 MG disintegrating tablet Take 1 tablet (10 mg total) by mouth at bedtime.    oxyCODONE (OXY IR/ROXICODONE) 5 MG immediate release tablet Take 1 tablet (5 mg total) by mouth every 4 (four) hours as needed for moderate pain. Qty: 30 tablet, Refills: 0      CONTINUE these medications which have NOT CHANGED   Details  carbidopa-levodopa (SINEMET IR) 25-100 MG tablet Take 1 tablet by mouth 3 (three) times daily.    cetirizine (ZYRTEC ALLERGY) 10 MG tablet Take 1 tablet (10 mg total) by mouth daily. Qty: 30 tablet, Refills: 0    divalproex (DEPAKOTE ER) 500 MG 24 hr tablet Take 1,000 mg by mouth daily.    docusate sodium (COLACE) 250 MG capsule Take 750 mg by mouth at bedtime.    doxepin (SINEQUAN) 10 MG capsule Take 10 mg by mouth at bedtime.    fluticasone (FLONASE) 50 MCG/ACT nasal spray Place 1 spray into both nostrils daily. Qty: 16 g, Refills: 0    polyethylene glycol (MIRALAX / GLYCOLAX) packet Take 17 g by mouth daily as needed.    primidone (MYSOLINE) 50 MG tablet Take 50 mg by mouth 3 (three) times daily.    SILENOR 3 MG TABS Take 3 mg by mouth daily.    simethicone (GAS-X) 80 MG chewable tablet Chew 1 tablet (80 mg total) by mouth 4 (four) times daily as needed for flatulence. Qty: 100 tablet, Refills: 0      STOP taking these medications     aspirin 325 MG EC tablet      atorvastatin (LIPITOR) 20 MG tablet      Cholecalciferol (VITAMIN D3) 2000 units CHEW      folic acid  (FOLVITE) 1 MG tablet          DISCHARGE INSTRUCTIONS:   Transfer patient to hospice home when bed is available  DIET:  Regular diet with supplements  DISCHARGE CONDITION:  Fair  ACTIVITY:  Activity as tolerated  OXYGEN:  Home Oxygen: No.   Oxygen Delivery: room air  DISCHARGE LOCATION:  Hospice home   If you experience worsening of your admission symptoms, develop shortness of breath, life threatening emergency, suicidal or homicidal thoughts you must seek medical attention immediately by calling 911 or calling your MD immediately  if symptoms less severe.  You Must read complete instructions/literature along with all the possible adverse reactions/side effects for all the Medicines you take and that have been prescribed to you. Take any new Medicines after you have completely understood and accpet all the possible adverse reactions/side effects.   Please note  You were cared for by a hospitalist during your hospital stay. If you have any questions about your discharge medications or the care you received while you  were in the hospital after you are discharged, you can call the unit and asked to speak with the hospitalist on call if the hospitalist that took care of you is not available. Once you are discharged, your primary care physician will handle any further medical issues. Please note that NO REFILLS for any discharge medications will be authorized once you are discharged, as it is imperative that you return to your primary care physician (or establish a relationship with a primary care physician if you do not have one) for your aftercare needs so that they can reassess your need for medications and monitor your lab values.     Today  Chief Complaint  Patient presents with  . Abdominal Pain   Patient is feeling better denies any pain. No abdominal discomfort. Son at bedside. Patient and her son ar ie agreeable with hospice home and comfort care measures awaiting to  be transferred to hospice home today. Patient answers few questions  ROS: limited  CONSTITUTIONAL: Denies fevers, chills. Denies any fatigue, weakness. RESPIRATORY: Denies cough, wheeze, shortness of breath.  CARDIOVASCULAR: Denies chest pain, palpitations, edema.  GASTROINTESTINAL: Denies nausea, vomiting, diarrhea, abdominal pain. Denies bright red blood per rectum. GENITOURINARY: Denies dysuria, hematuria. ENDOCRINE: Denies nocturia or thyroid problems. HEMATOLOGIC AND LYMPHATIC: Denies easy bruising or bleeding. SKIN: Denies rash or lesion.   VITAL SIGNS:  Blood pressure (!) 155/82, pulse 87, temperature 98 F (36.7 C), temperature source Oral, resp. rate 18, height 5\' 6"  (1.676 m), weight 76.6 kg (168 lb 14.4 oz), SpO2 96 %.  I/O:    Intake/Output Summary (Last 24 hours) at 04/28/17 1014 Last data filed at 04/28/17 0823  Gross per 24 hour  Intake             1804 ml  Output              501 ml  Net             1303 ml    PHYSICAL EXAMINATION:  GENERAL:  82 y.o.-year-old patient lying in the bed with no acute distress.  EYES: Pupils equal, round, reactive to light and accommodation. No scleral icterus. Extraocular muscles intact.  HEENT: Head atraumatic, normocephalic. Oropharynx and nasopharynx clear.  NECK:  Supple, no jugular venous distention. No thyroid enlargement, no tenderness.  LUNGS: Normal breath sounds bilaterally, no wheezing, rales,rhonchi or crepitation. No use of accessory muscles of respiration.  CARDIOVASCULAR: S1, S2 normal. No murmurs, rubs, or gallops.  ABDOMEN: Soft, non-tender, some distension. Bowel sounds present. EXTREMITIES: No pedal edema, cyanosis, or clubbing.  NEUROLOGIC: Awake and alert and answering few questions appropriately oriented 2  PSYCHIATRIC: The patient is alert and oriented x 2.  SKIN: No obvious rash, lesion, or ulcer.   DATA REVIEW:   CBC  Recent Labs Lab 04/27/17 0309  WBC 9.0  HGB 12.2  HCT 35.7  PLT 304     Chemistries   Recent Labs Lab 04/23/17 0605  04/27/17 0309  NA 140  < > 138  K 3.3*  < > 4.2  CL 103  < > 101  CO2 30  < > 29  GLUCOSE 116*  < > 122*  BUN <5*  < > <5*  CREATININE 0.33*  < > 0.38*  CALCIUM 8.8*  < > 8.9  MG 1.7  --   --   AST  --   --  24  ALT  --   --  7*  ALKPHOS  --   --  68  BILITOT  --   --  0.3  < > = values in this interval not displayed.  Cardiac Enzymes No results for input(s): TROPONINI in the last 168 hours.  Microbiology Results  Results for orders placed or performed during the hospital encounter of 04/19/17  MRSA PCR Screening     Status: None   Collection Time: 04/19/17  3:11 PM  Result Value Ref Range Status   MRSA by PCR NEGATIVE NEGATIVE Final    Comment:        The GeneXpert MRSA Assay (FDA approved for NASAL specimens only), is one component of a comprehensive MRSA colonization surveillance program. It is not intended to diagnose MRSA infection nor to guide or monitor treatment for MRSA infections.     RADIOLOGY:  Ct Head Wo Contrast  Result Date: 04/25/2017 CLINICAL DATA:  82 year old female with unexplained altered mental status. Parkinson disease. EXAM: CT HEAD WITHOUT CONTRAST TECHNIQUE: Contiguous axial images were obtained from the base of the skull through the vertex without intravenous contrast. COMPARISON:  None. FINDINGS: Brain: Cerebral volume loss appears generalized. Diffuse ventricular prominence, probably ex vacuo related. Superimposed confluent bilateral cerebral white matter hypodensity. There is encephalomalacia at the left anterior temporal lobe tip best seen on sagittal image 42. No other cortical encephalomalacia identified ; probable sulcal variation along the left parieto-occipital sulcus (sagittal image 33). No midline shift, mass effect, evidence of mass lesion, intracranial hemorrhage or evidence of cortically based acute infarction. Vascular: Some intracranial artery dolichoectasia and calcified  atherosclerosis. No suspicious intracranial vascular hyperdensity. Skull: Osteopenia.  No acute osseous abnormality identified. Sinuses/Orbits: Clear. Other: Visualized orbits and scalp soft tissues are within normal limits. IMPRESSION: 1.  No acute intracranial abnormality. 2. Cerebral volume loss with probably ex vacuo ventricular enlargement, advanced bilateral cerebral white matter disease, and left anterior temporal lobe encephalomalacia. Electronically Signed   By: Genevie Ann M.D.   On: 04/25/2017 12:46   Dg Abd Portable 1v  Result Date: 04/25/2017 CLINICAL DATA:  Abdominal distention EXAM: PORTABLE ABDOMEN - 1 VIEW COMPARISON:  04/21/2017, 04/23/2017 FINDINGS: Diffuse gaseous distension of bowel is noted involving the stomach, small bowel, and large bowel. Mild improvement from the prior studies. Surgical clips in the gallbladder fossa. IMPRESSION: Severe adynamic ileus with mild interval improvement. Electronically Signed   By: Franchot Gallo M.D.   On: 04/25/2017 17:31    EKG:   Orders placed or performed in visit on 04/19/17  . EKG 12-Lead  . EKG 12-Lead  . EKG 12-Lead      Management plans discussed with the patient, family and they are in agreement.  CODE STATUS:     Code Status Orders        Start     Ordered   04/27/17 1415  Do not attempt resuscitation (DNR)  Continuous    Question Answer Comment  In the event of cardiac or respiratory ARREST Do not call a "code blue"   In the event of cardiac or respiratory ARREST Do not perform Intubation, CPR, defibrillation or ACLS   In the event of cardiac or respiratory ARREST Use medication by any route, position, wound care, and other measures to relive pain and suffering. May use oxygen, suction and manual treatment of airway obstruction as needed for comfort.      04/27/17 1414    Code Status History    Date Active Date Inactive Code Status Order ID Comments User Context   04/19/2017  2:18 PM 04/27/2017  2:14 PM  Full Code  [id number]  Vaughan Basta, MD Inpatient      TOTAL TIME TAKING CARE OF THIS PATIENT: 43  minutes.   Note: This dictation was prepared with Dragon dictation along with smaller phrase technology. Any transcriptional errors that result from this process are unintentional.   @MEC @  on 04/28/2017 at 10:14 AM  Between 7am to 6pm - Pager - [phone]  After 6pm go to [email or web address] - password [email or web address] [facility]  Office  [phone]  CC: Primary care physician; Patient, No Pcp Per

## 2017-08-17 DIAGNOSIS — Z515 Encounter for palliative care: Secondary | ICD-10-CM

## 2017-08-17 DIAGNOSIS — R109 Unspecified abdominal pain: Secondary | ICD-10-CM

## 2017-08-17 DIAGNOSIS — Z7189 Other specified counseling: Secondary | ICD-10-CM

## 2017-09-04 DEATH — deceased

## 2018-10-18 IMAGING — CR DG ABDOMEN ACUTE W/ 1V CHEST
1 series · 4 of 4 positions shown · non-contrast
Comparison: 04/19/2017

CLINICAL DATA: Ogilvie's syndrome

EXAM:
DG ABDOMEN ACUTE W/ 1V CHEST

[Series 1: dg abd acute w/chest · 0.14mm/px · 4 of 4 slices shown]
[im 1/4]
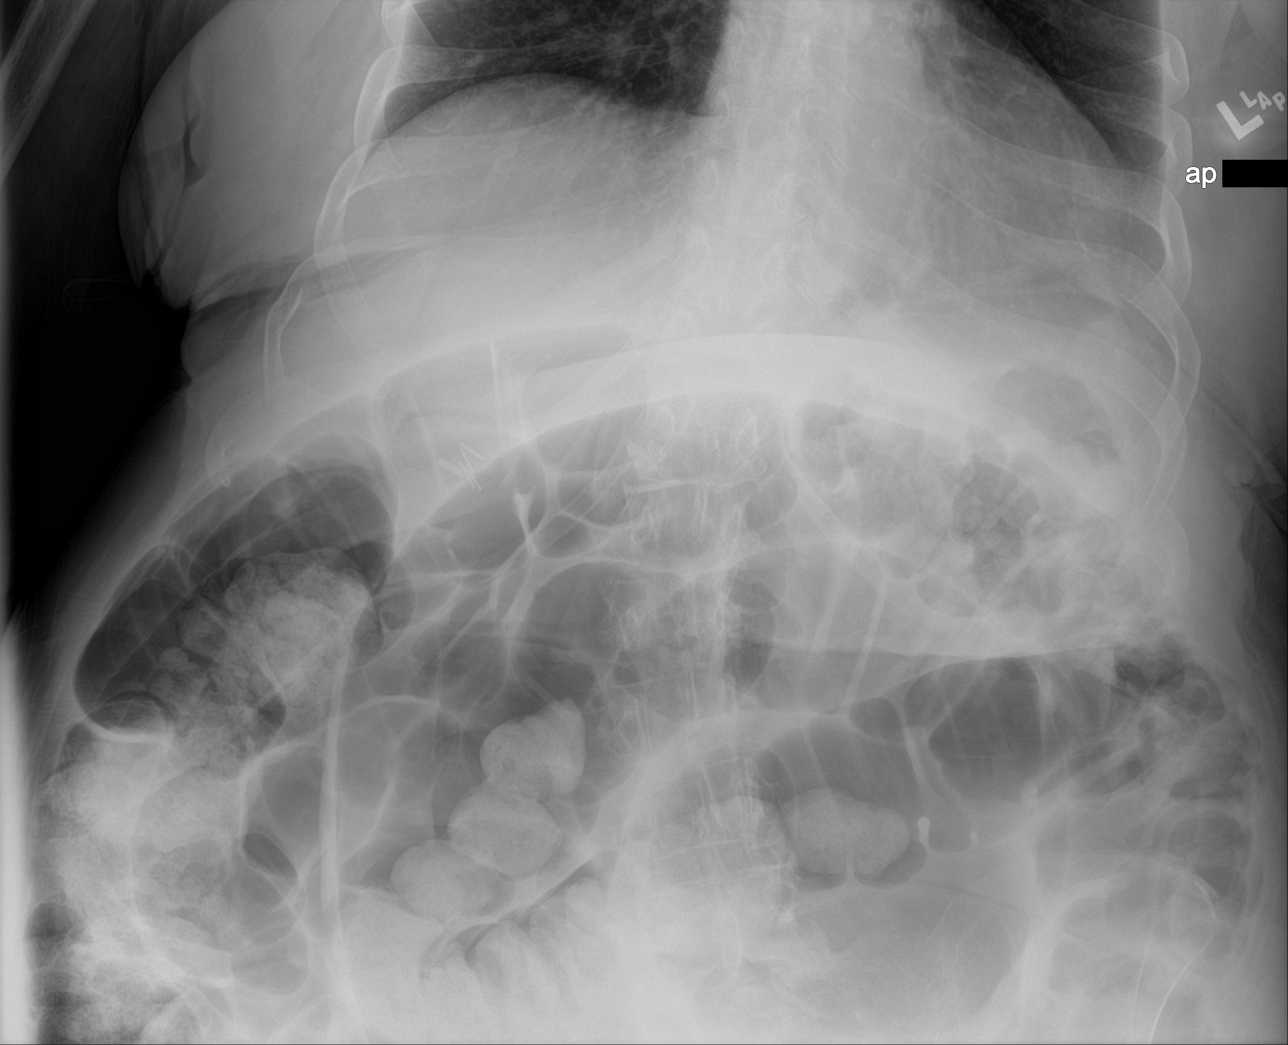
[im 2/4]
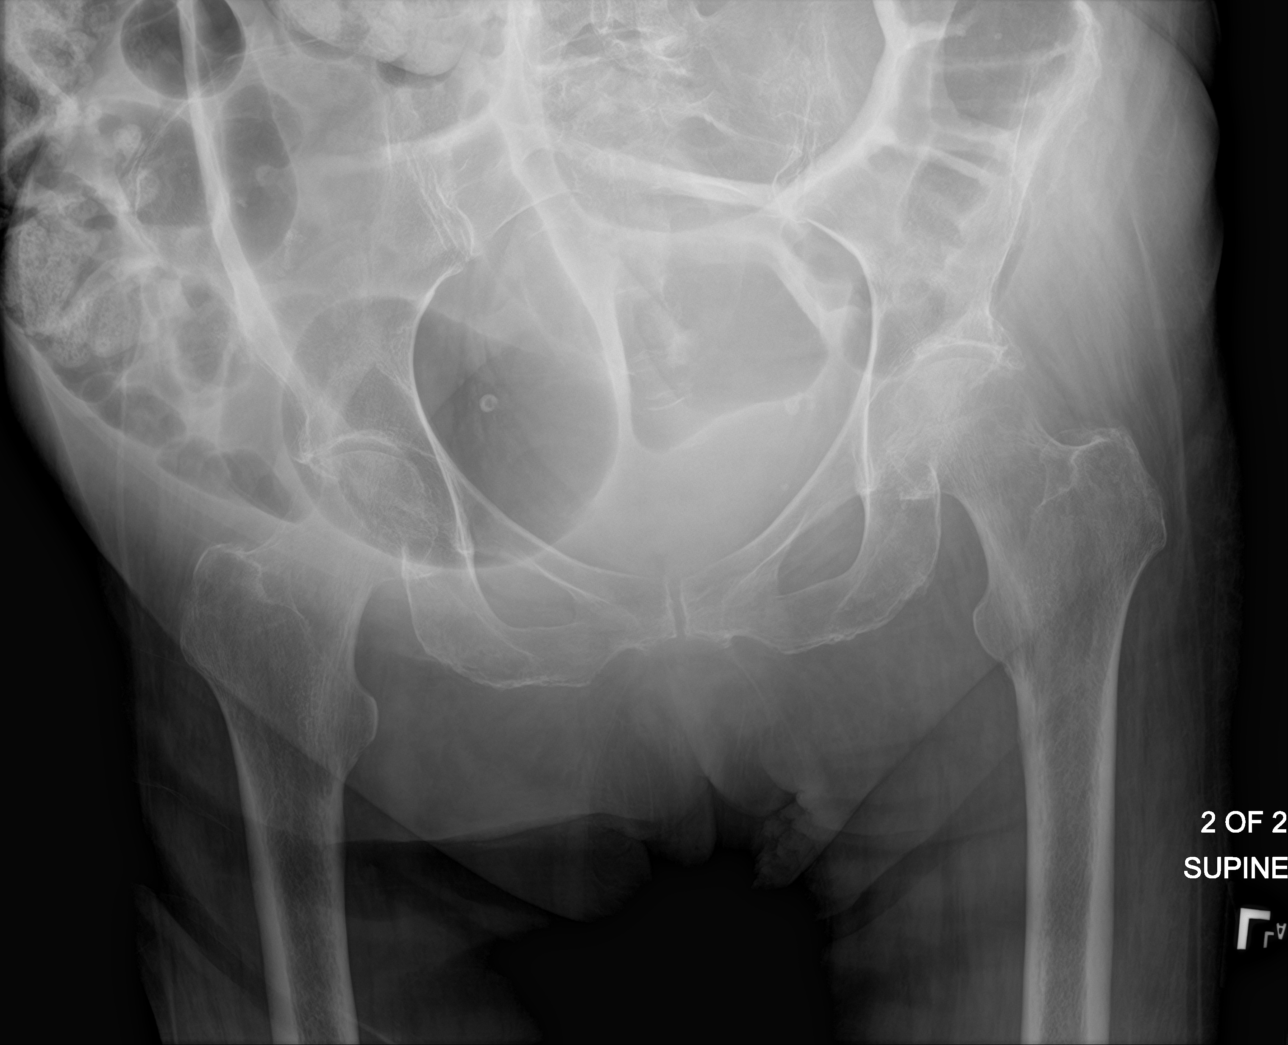
[im 3/4]
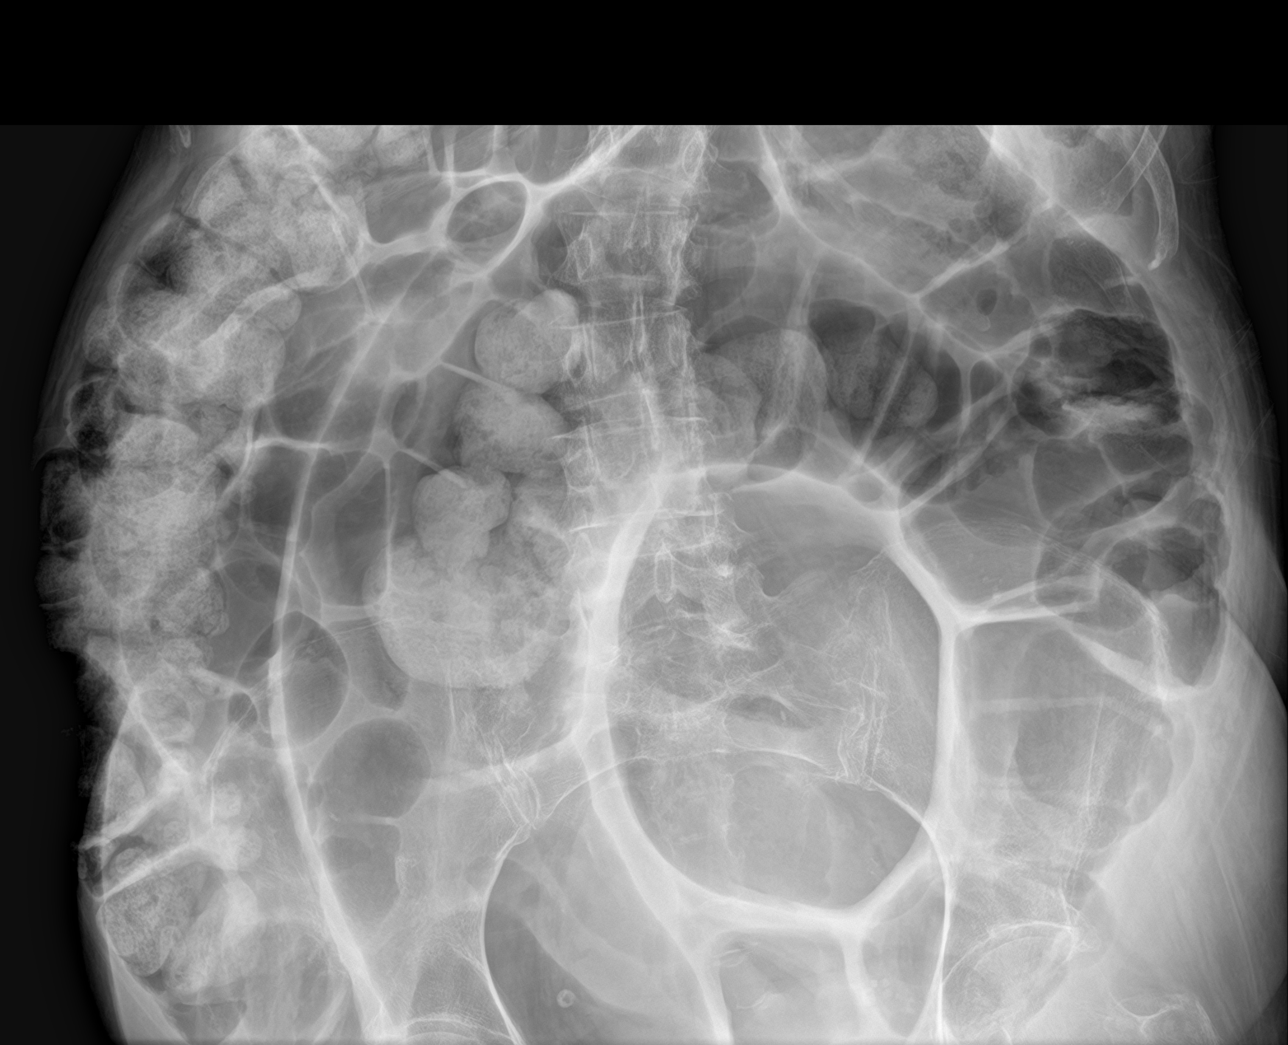
[im 4/4]
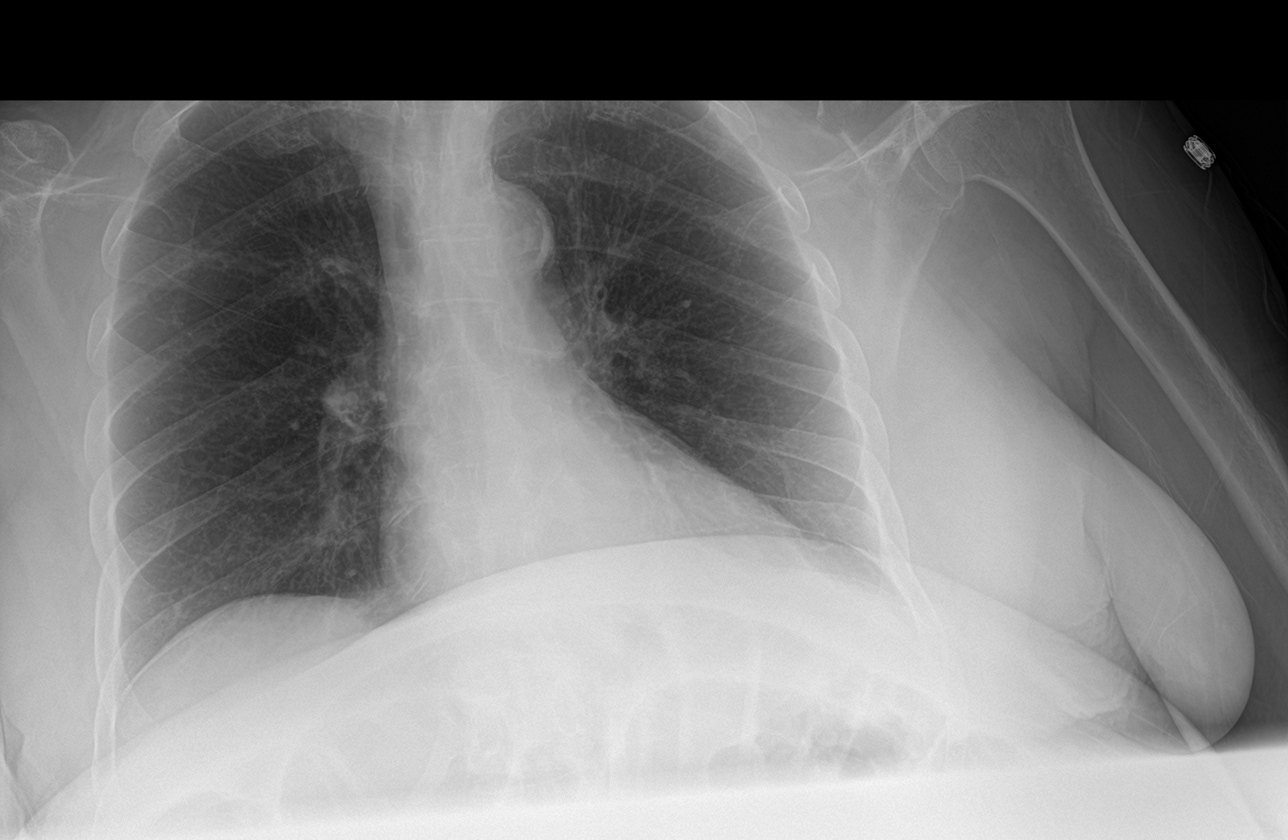

[4 of 4 positions shown; findings below may reference images not displayed]

FINDINGS: Heart is normal size.  Lungs are clear.  No effusions.

Marked gaseous distention of the colon again noted, not
significantly changed. Prior CABG. No organomegaly or free air.
IMPRESSION: Continued marked gaseous distention of the colon, unchanged. No free
air.

No acute cardiopulmonary disease.

## 2018-10-19 IMAGING — DX DG ABDOMEN 2V
3 series · 3 of 3 positions shown · non-contrast
Comparison: 04/20/2017; 04/15/2017; CT abdomen pelvis - 04/16/2017

CLINICAL DATA: Abdominal distention.

EXAM:
ABDOMEN - 2 VIEW

[abdomen erect]
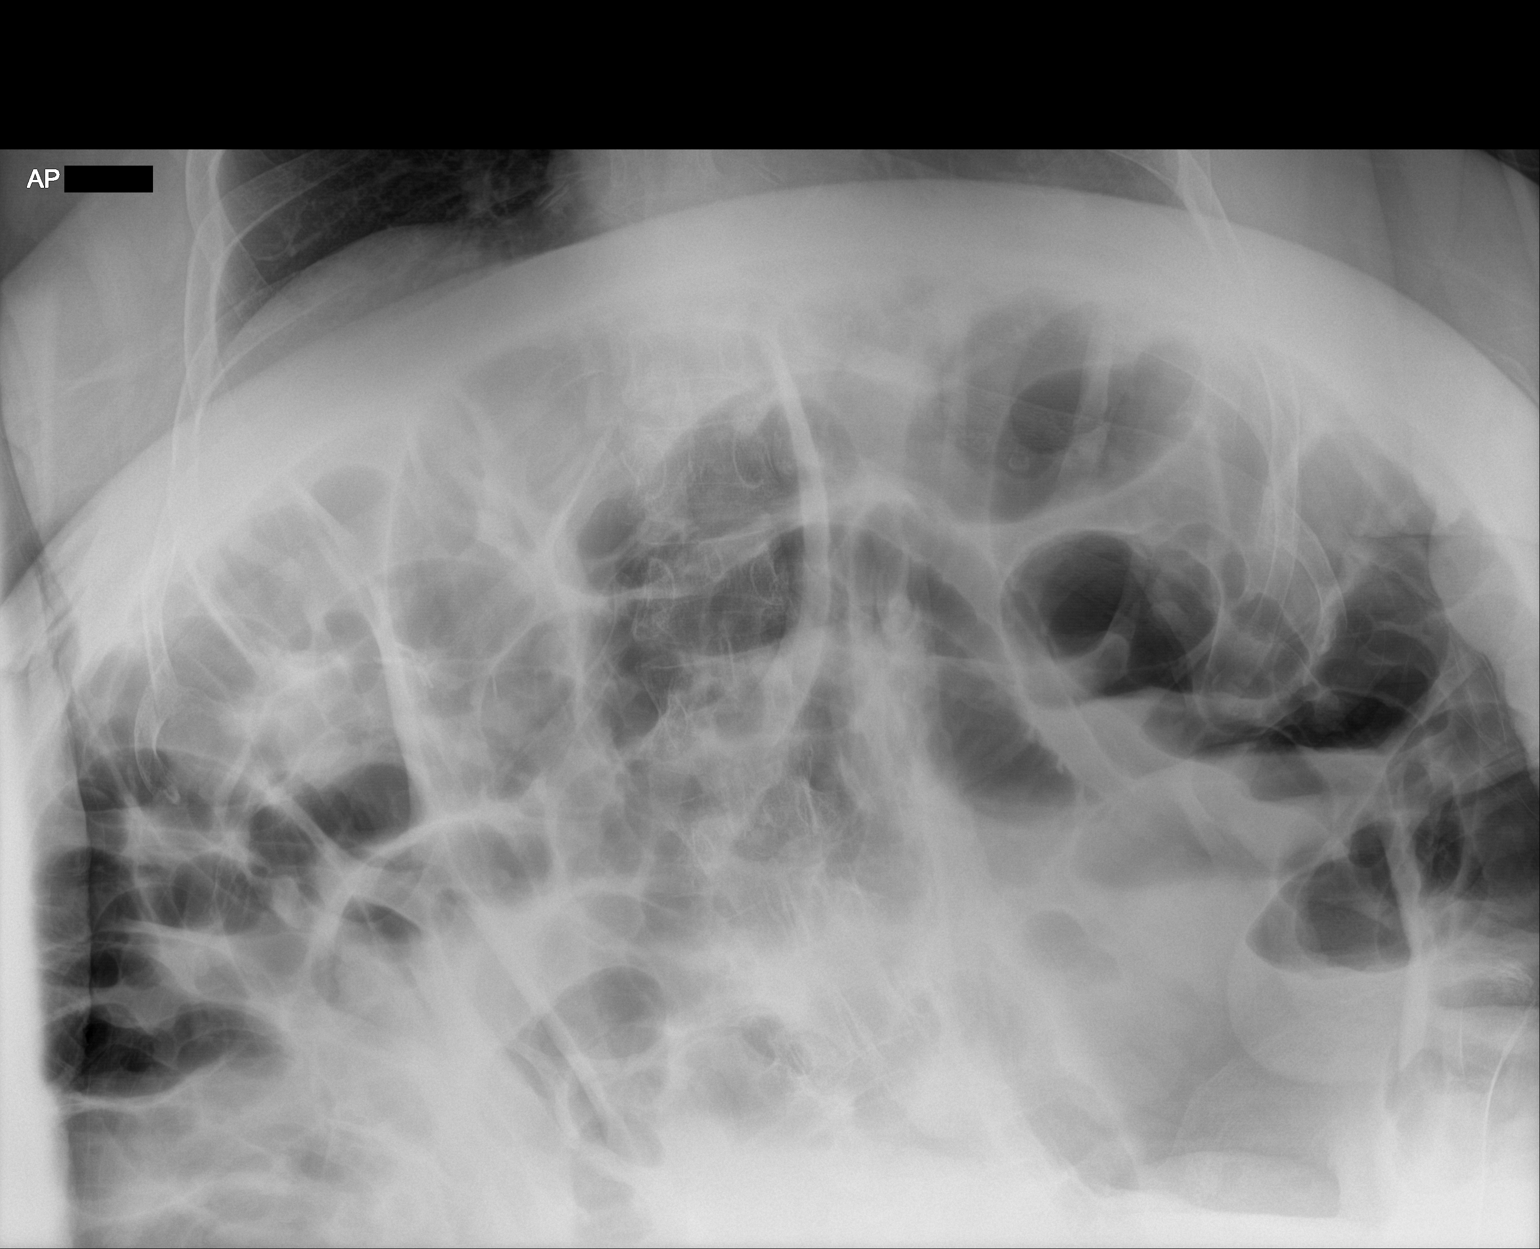

[abdomen supine (1 of 2)]
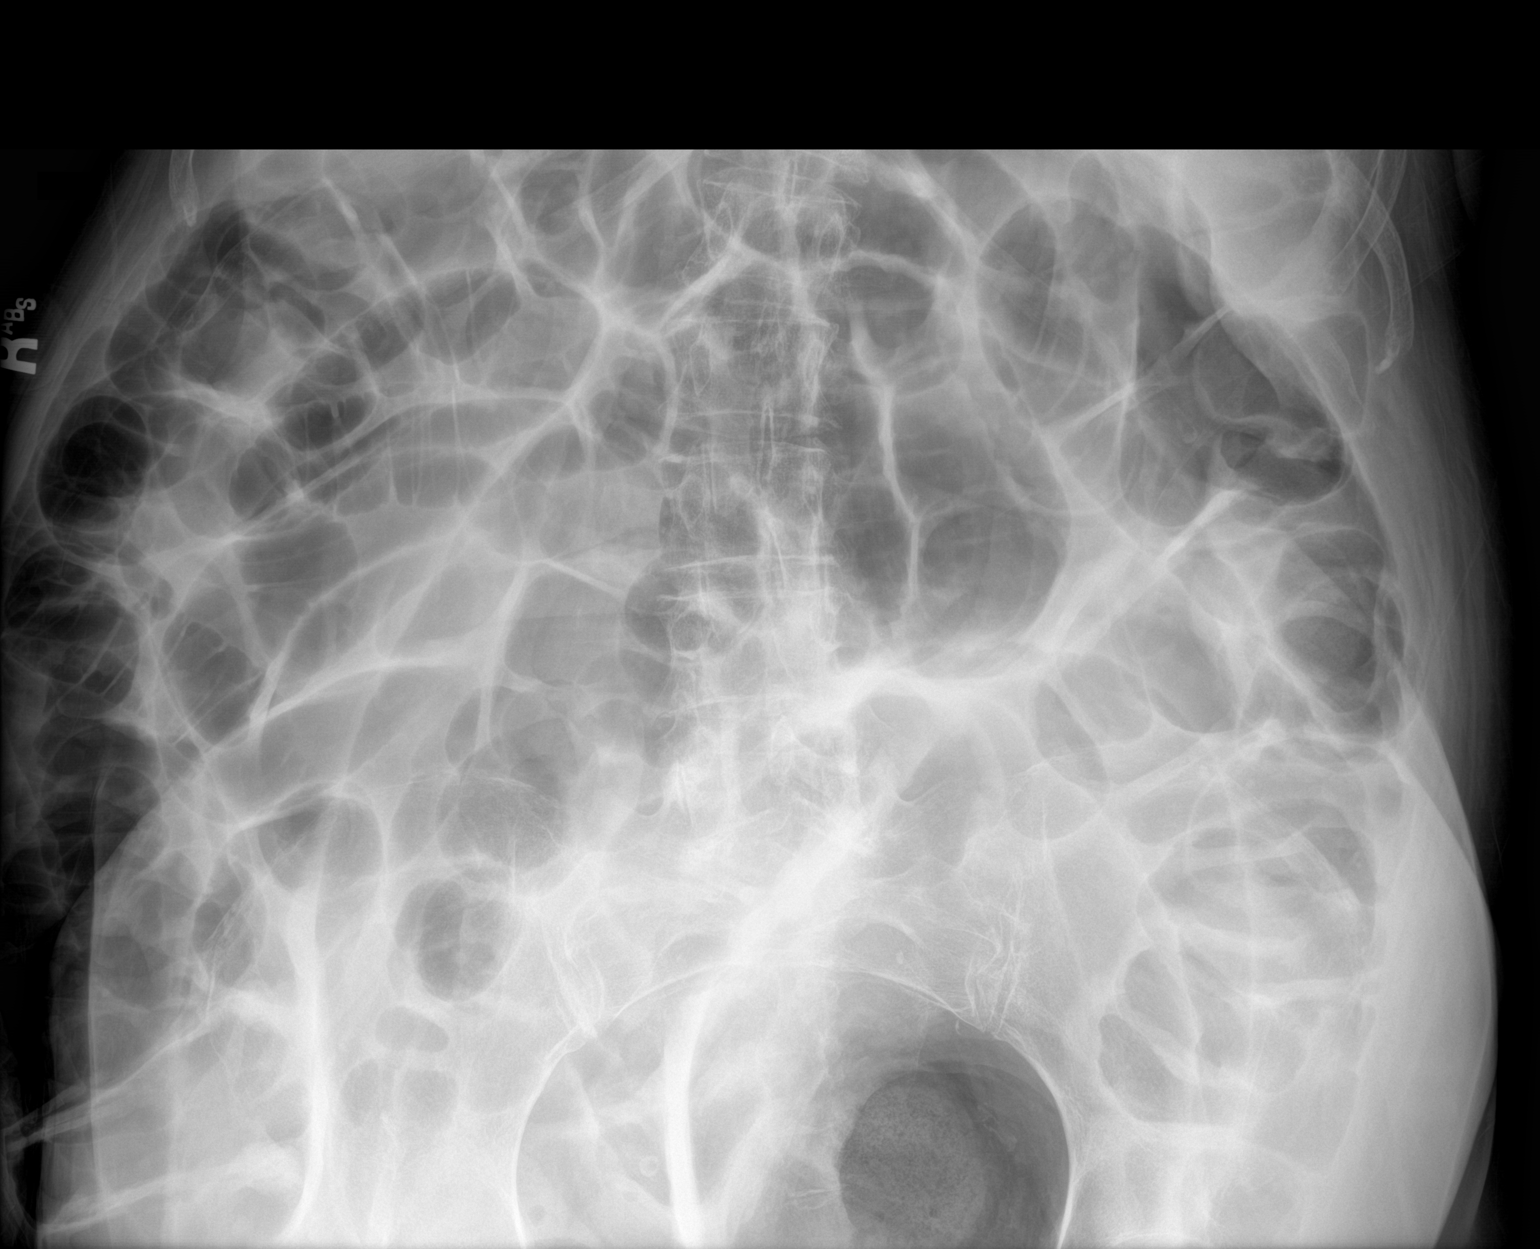

[abdomen supine (2 of 2)]
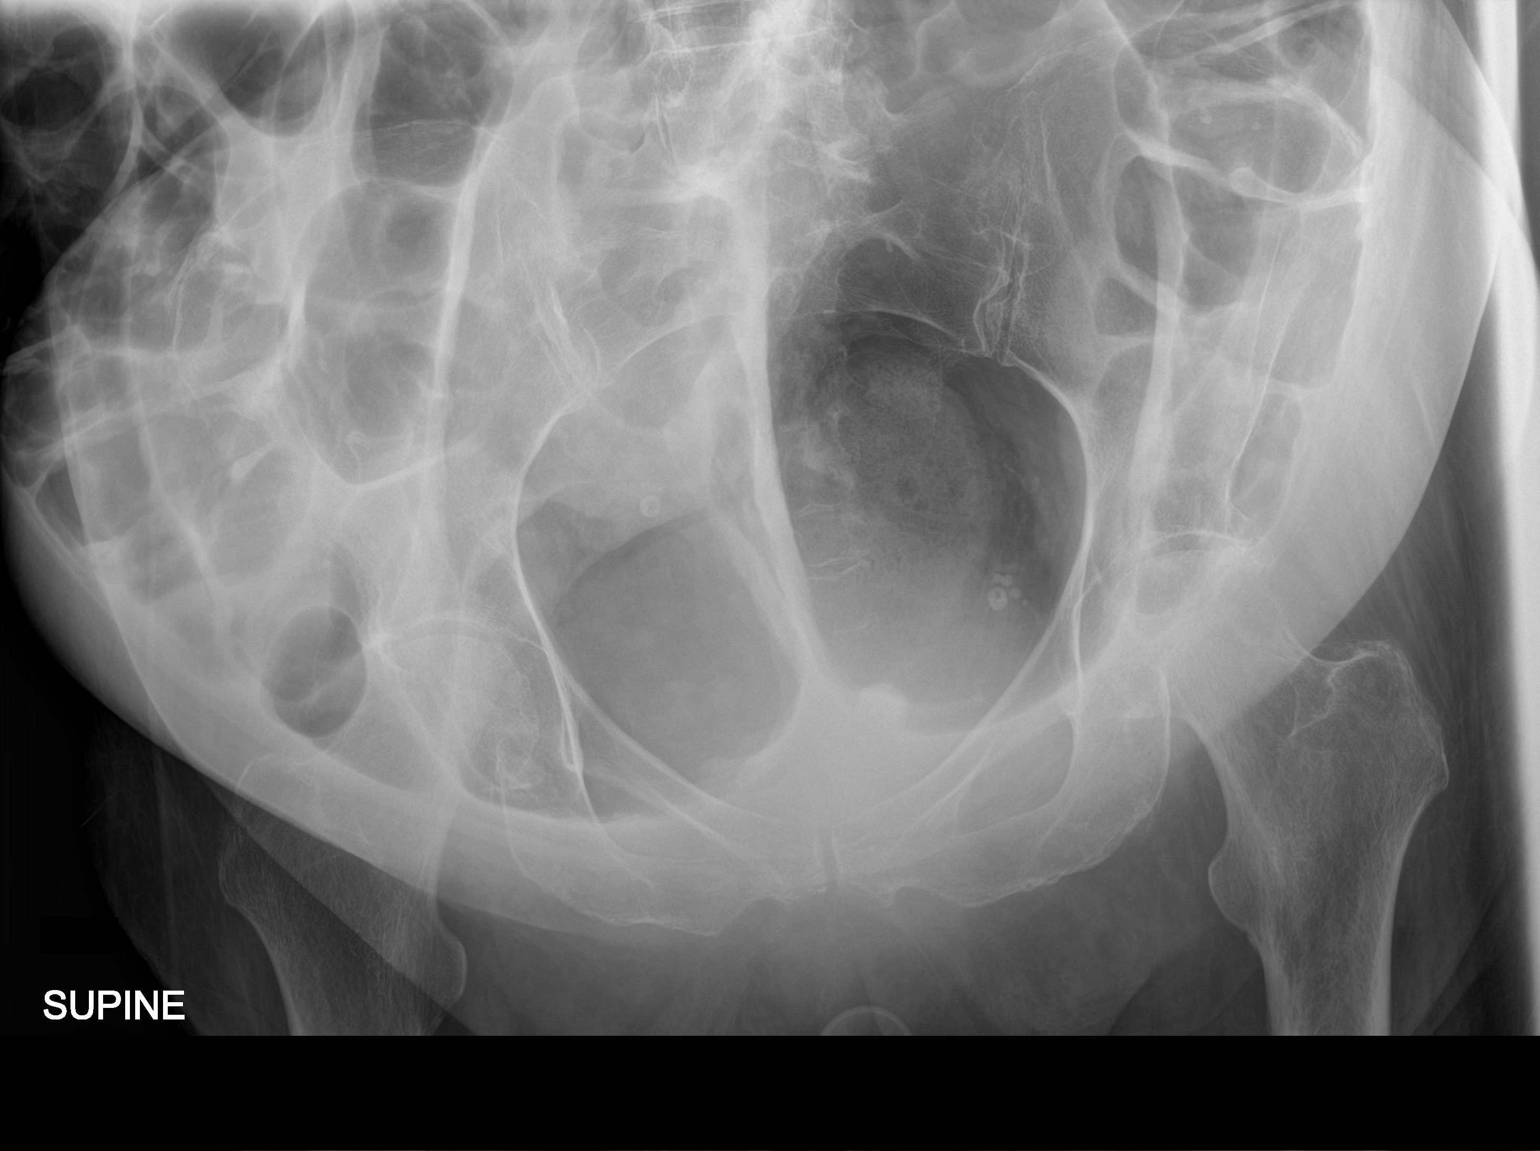

[3 of 3 positions shown; findings below may reference images not displayed]

FINDINGS: Re- demonstrated marked gas distention of multiple loops of large
and small bowel. No definite evidence of volvulus. No definite
pneumoperitoneum, pneumatosis or portal venous gas.

Several phleboliths overlie the lower pelvis bilaterally. Otherwise,
no definitive abnormal intra- abdominal calcifications.

No definite acute osseous abnormalities.
IMPRESSION: Re- demonstrated findings most suggestive of severe adynamic ileus.

## 2018-10-21 IMAGING — CT CT CTA ABD/PEL W/CM AND/OR W/O CM
3 of 9 series · 10 of 46 positions shown, 16 images · IV contrast (isovue)
Comparison: 04/16/2017

CLINICAL DATA: 82-year-old with abdominal distention and pain.
Gastroenteritis or colitis suspected.

EXAM:
CT ANGIOGRAPHY ABDOMEN AND PELVIS WITH CONTRAST CONTRAST
TECHNIQUE: Multidetector CT imaging of the abdomen and pelvis was performed
using the standard protocol during bolus administration of
intravenous contrast. Multiplanar reconstructed images and MIPs were
obtained and reviewed to evaluate the vascular anatomy.
CONTRAST:  100 mL Isovue 370

[Series 4: axial arterial · axial · arterial · 0.92mm/px · z∈[-1151,-1091]mm · 2 of 269 slices shown]
[im 30/269  soft-tissue]
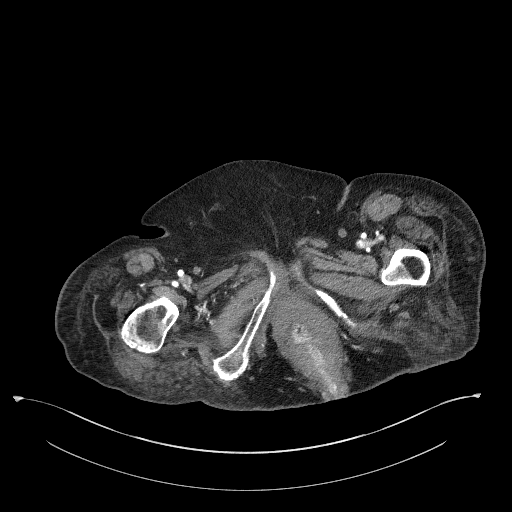
[im 60/269  soft-tissue]
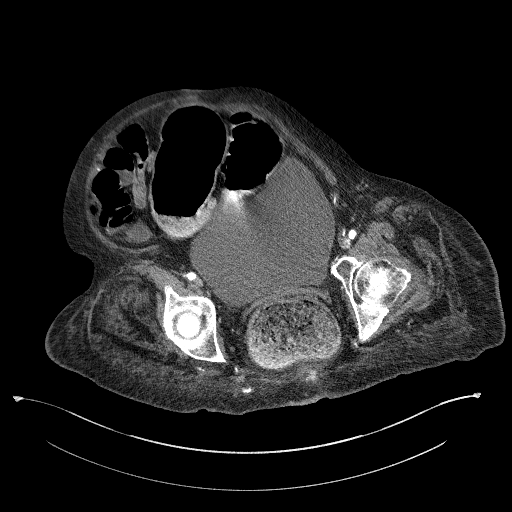

[Series 5: axial venous · axial · portal-venous · 0.92mm/px · z∈[-1133,-753]mm · 6 of 108 slices shown, 11 images]
[im 16/108  soft-tissue]
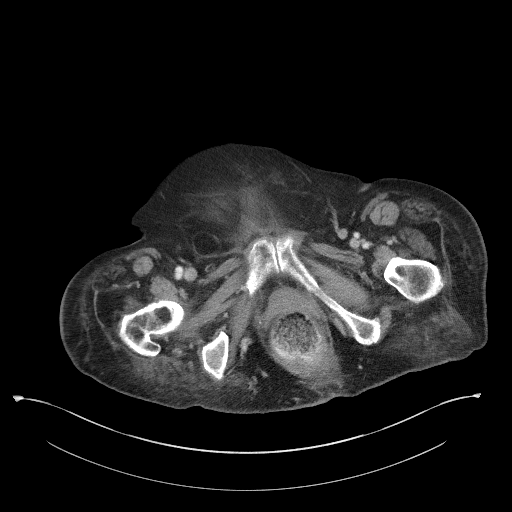
[im 16/108  bone]
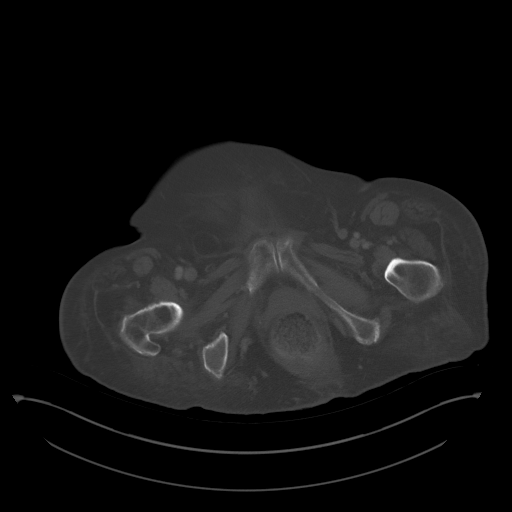
[im 31/108  soft-tissue]
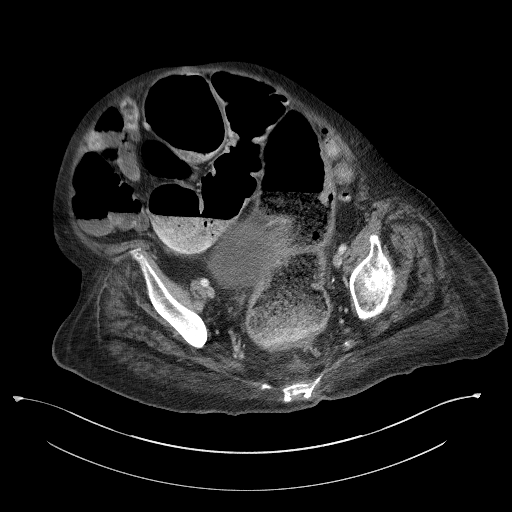
[im 46/108  soft-tissue]
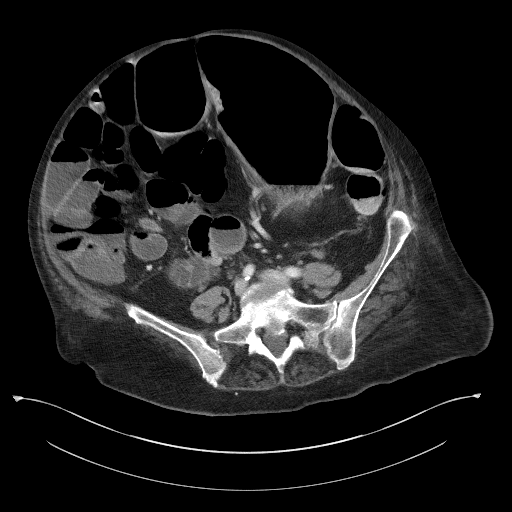
[im 46/108  lung]
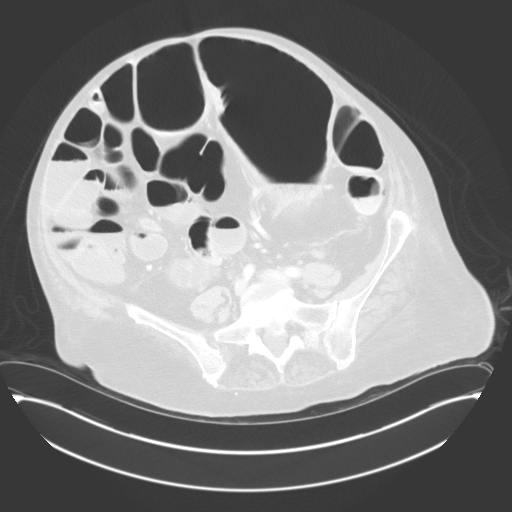
[im 62/108  soft-tissue]
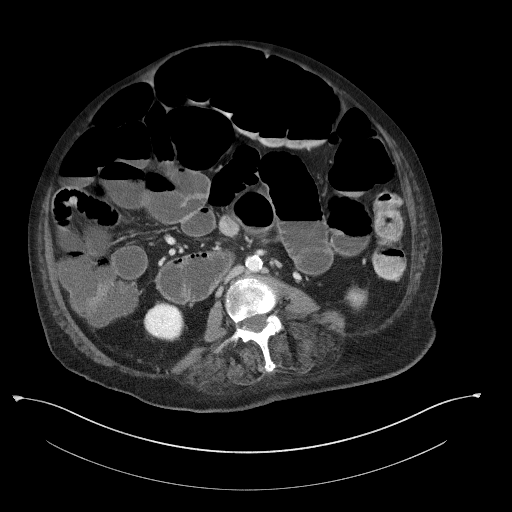
[im 62/108  lung]
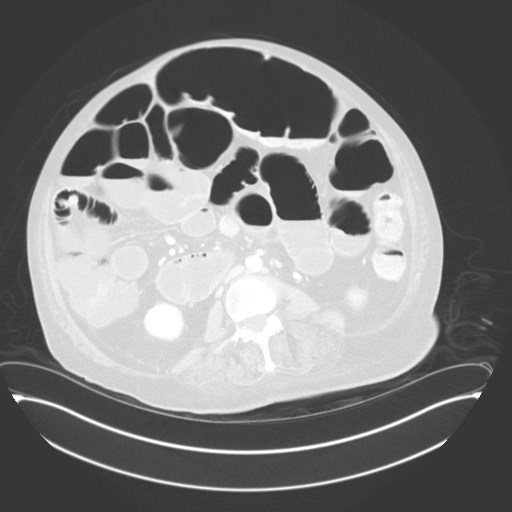
[im 77/108  soft-tissue]
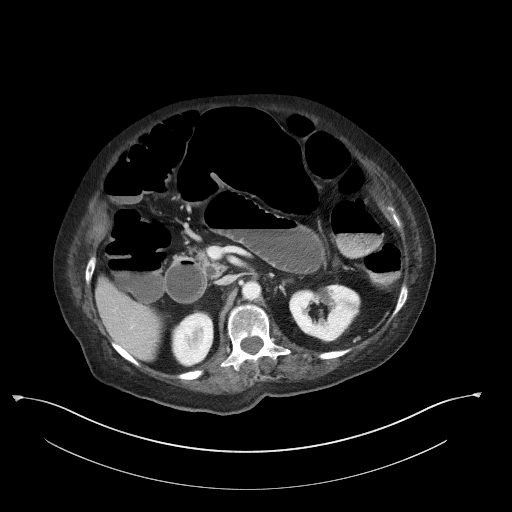
[im 77/108  lung]
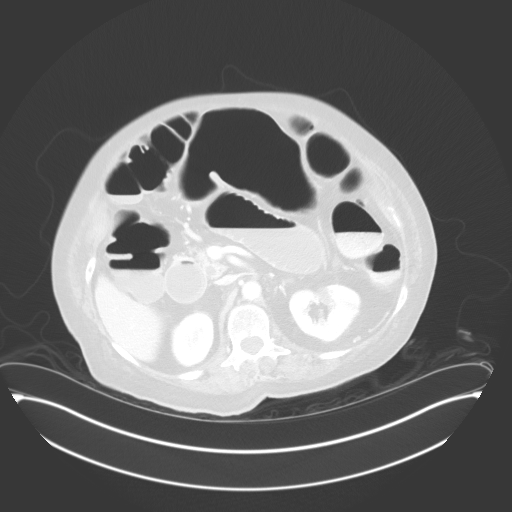
[im 92/108  soft-tissue]
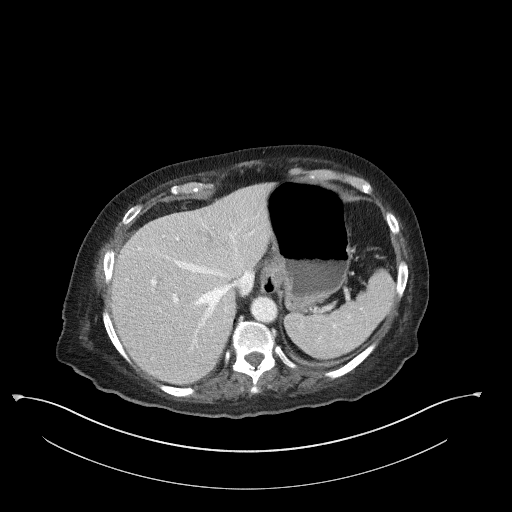
[im 92/108  lung]
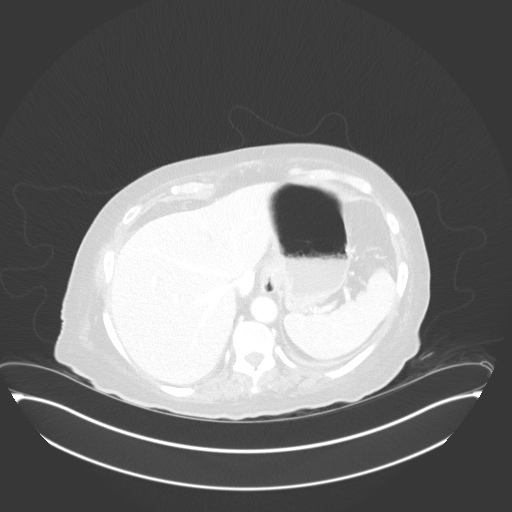

[Series 8: coronal mpr · coronal · 0.92mm/px · 2 of 204 slices shown, 3 images]
[im 68/204  soft-tissue]
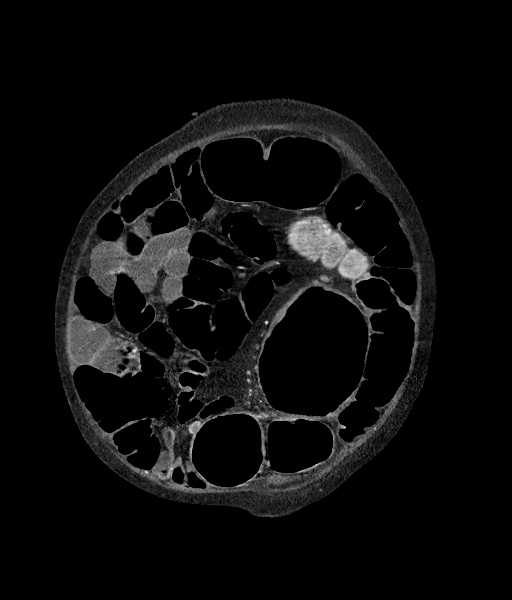
[im 68/204  bone]
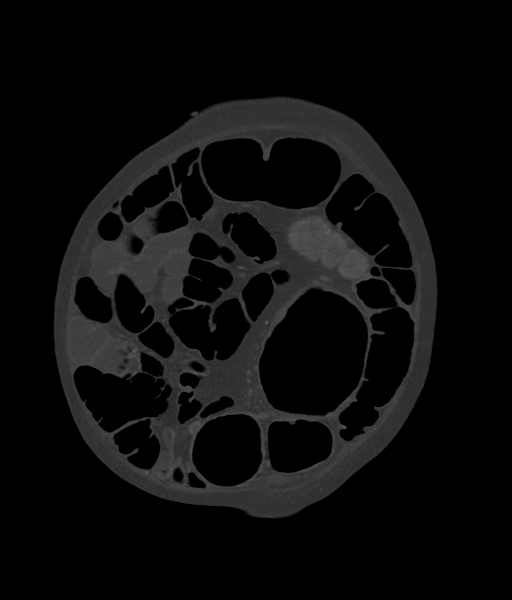
[im 136/204  soft-tissue]
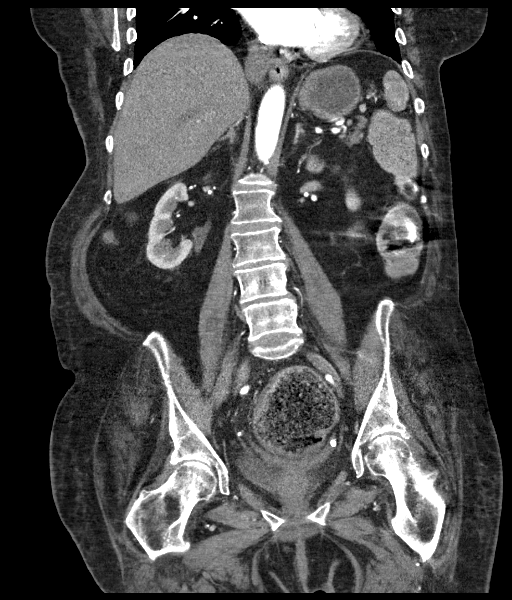

[10 of 46 positions shown; findings below may reference images not displayed]

FINDINGS: VASCULAR

Aorta: Atherosclerotic calcifications in the abdominal aorta without
dissection or aneurysm.

Celiac: Patent without evidence of aneurysm, dissection, vasculitis
or significant stenosis.

SMA: Patent without evidence of aneurysm, dissection, vasculitis or
significant stenosis.

Renals: Both renal arteries are patent without evidence of aneurysm,
dissection, vasculitis, fibromuscular dysplasia or significant
stenosis.

IMA: Patent without evidence of aneurysm, dissection, vasculitis or
significant stenosis.

Inflow: Patent without evidence of aneurysm, dissection, vasculitis
or significant stenosis.

Proximal Outflow: Proximal femoral arteries are patent bilaterally.

Veins: Portal venous system is patent.  IVC is patent.

Review of the MIP images confirms the above findings.

NON-VASCULAR

Lower chest: Trace bilateral pleural fluid. Patchy parenchymal
densities at the right lung base concerning for infection or
inflammation. Right lung disease is new.

Hepatobiliary: Gallbladder has been removed. Minimal intrahepatic
biliary dilatation. No suspicious liver lesions.

Pancreas: Normal appearance of the pancreas without inflammation or
duct dilatation.

Spleen: Normal appearance of spleen without enlargement.

Adrenals/Urinary Tract: Adrenal glands are within normal limits.
Evidence for left renal cysts. One cystic structure along the
posterior left kidney is too small to definitively characterize. No
hydronephrosis. Moderate distention of the urinary bladder.

Stomach/Bowel: Increased small bowel dilatation compared to the
recent comparison examination. Small bowel loops now measure up to
4.9 cm. Again noted is marked distension of the rectum and sigmoid
colon. No evidence to suggest a volvulus. Again noted is a large
amount of stool-like material in the rectum and sigmoid colon. Stool
material has a fluid component based on the air-fluid level. Again
noted is mild wall thickening in the rectum and sigmoid colon with
mild pericolonic stranding. There is a decompressed loop of small
bowel in the central abdomen on series 4, image 33.

Lymphatic: No significant lymph node enlargement in the abdomen or
pelvis.

Reproductive: Status post hysterectomy. No adnexal masses.

Other: No free fluid.  No free air.

Musculoskeletal: No acute abnormality.
IMPRESSION: VASCULAR

Mild atherosclerotic disease in the abdomen and pelvis. No stenosis
involving the mesenteric arteries. No evidence for mesenteric
ischemia.

NON-VASCULAR

Again noted is marked distension of the distal colon with mild wall
thickening and mild pericolonic edema in the rectal region. Large
amount of liquid stool in the rectum and sigmoid colon. Findings
could be associated with chronic constipation. Difficult to exclude
mild colitis.

Increased small bowel distension compared to the recent comparison
examination. There is concern for a possible transition point in the
mid abdomen and findings could be associated with an ileus or
partial small bowel obstruction.

New parenchymal densities at the right lung base with trace pleural
effusions. Findings are suggestive for right lower lung pneumonia.
This is new since 04/16/2017.

These results will be called to the ordering clinician or
representative by the Radiologist Assistant, and communication
documented in the PACS or zVision Dashboard.
